# Patient Record
Sex: Female | Born: 1975 | Race: Black or African American | Hispanic: No | Marital: Single | Smoking: Current every day smoker
Health system: Southern US, Community
[De-identification: ages and names within clinical notes are randomized; demographics above are authoritative.]

## PROBLEM LIST (undated history)

## (undated) DIAGNOSIS — D573 Sickle-cell trait: Secondary | ICD-10-CM

## (undated) DIAGNOSIS — IMO0002 Reserved for concepts with insufficient information to code with codable children: Secondary | ICD-10-CM

## (undated) DIAGNOSIS — D649 Anemia, unspecified: Secondary | ICD-10-CM

## (undated) DIAGNOSIS — T8189XA Other complications of procedures, not elsewhere classified, initial encounter: Secondary | ICD-10-CM

## (undated) DIAGNOSIS — L0291 Cutaneous abscess, unspecified: Secondary | ICD-10-CM

## (undated) DIAGNOSIS — A539 Syphilis, unspecified: Secondary | ICD-10-CM

## (undated) DIAGNOSIS — D571 Sickle-cell disease without crisis: Secondary | ICD-10-CM

## (undated) DIAGNOSIS — M329 Systemic lupus erythematosus, unspecified: Secondary | ICD-10-CM

## (undated) HISTORY — PX: IRRIGATION AND DEBRIDEMENT ABSCESS: SHX5252

## (undated) HISTORY — DX: Anemia, unspecified: D64.9

## (undated) HISTORY — DX: Sickle-cell disease without crisis: D57.1

## (undated) HISTORY — DX: Cutaneous abscess, unspecified: L02.91

## (undated) HISTORY — DX: Syphilis, unspecified: A53.9

---

## 1999-02-10 ENCOUNTER — Other Ambulatory Visit: Admission: RE | Admit: 1999-02-10 | Discharge: 1999-02-10 | Payer: Self-pay | Admitting: *Deleted

## 1999-09-17 ENCOUNTER — Other Ambulatory Visit: Admission: RE | Admit: 1999-09-17 | Discharge: 1999-09-17 | Payer: Self-pay | Admitting: *Deleted

## 2000-04-12 ENCOUNTER — Emergency Department (HOSPITAL_COMMUNITY): Admission: EM | Admit: 2000-04-12 | Discharge: 2000-04-12 | Payer: Self-pay | Admitting: Emergency Medicine

## 2000-09-26 ENCOUNTER — Emergency Department (HOSPITAL_COMMUNITY): Admission: EM | Admit: 2000-09-26 | Discharge: 2000-09-26 | Payer: Self-pay | Admitting: Emergency Medicine

## 2000-09-26 ENCOUNTER — Encounter: Payer: Self-pay | Admitting: Emergency Medicine

## 2001-10-23 ENCOUNTER — Ambulatory Visit (HOSPITAL_COMMUNITY): Admission: RE | Admit: 2001-10-23 | Discharge: 2001-10-23 | Payer: Self-pay | Admitting: *Deleted

## 2002-01-04 ENCOUNTER — Ambulatory Visit (HOSPITAL_COMMUNITY): Admission: RE | Admit: 2002-01-04 | Discharge: 2002-01-04 | Payer: Self-pay | Admitting: *Deleted

## 2002-01-30 ENCOUNTER — Inpatient Hospital Stay (HOSPITAL_COMMUNITY): Admission: AD | Admit: 2002-01-30 | Discharge: 2002-01-30 | Payer: Self-pay | Admitting: *Deleted

## 2002-02-25 ENCOUNTER — Inpatient Hospital Stay (HOSPITAL_COMMUNITY): Admission: AD | Admit: 2002-02-25 | Discharge: 2002-02-25 | Payer: Self-pay | Admitting: *Deleted

## 2002-03-30 ENCOUNTER — Encounter (HOSPITAL_COMMUNITY): Admission: AD | Admit: 2002-03-30 | Discharge: 2002-03-30 | Payer: Self-pay | Admitting: *Deleted

## 2002-04-03 ENCOUNTER — Inpatient Hospital Stay (HOSPITAL_COMMUNITY): Admission: AD | Admit: 2002-04-03 | Discharge: 2002-04-06 | Payer: Self-pay | Admitting: *Deleted

## 2002-06-11 ENCOUNTER — Inpatient Hospital Stay (HOSPITAL_COMMUNITY): Admission: AD | Admit: 2002-06-11 | Discharge: 2002-06-11 | Payer: Self-pay | Admitting: *Deleted

## 2006-09-03 ENCOUNTER — Emergency Department (HOSPITAL_COMMUNITY): Admission: EM | Admit: 2006-09-03 | Discharge: 2006-09-03 | Payer: Self-pay | Admitting: Emergency Medicine

## 2006-12-30 ENCOUNTER — Emergency Department (HOSPITAL_COMMUNITY): Admission: EM | Admit: 2006-12-30 | Discharge: 2006-12-30 | Payer: Self-pay | Admitting: Emergency Medicine

## 2007-02-08 ENCOUNTER — Emergency Department (HOSPITAL_COMMUNITY): Admission: EM | Admit: 2007-02-08 | Discharge: 2007-02-09 | Payer: Self-pay | Admitting: Emergency Medicine

## 2007-06-16 ENCOUNTER — Emergency Department (HOSPITAL_COMMUNITY): Admission: EM | Admit: 2007-06-16 | Discharge: 2007-06-16 | Payer: Self-pay | Admitting: Family Medicine

## 2007-07-03 ENCOUNTER — Encounter (HOSPITAL_BASED_OUTPATIENT_CLINIC_OR_DEPARTMENT_OTHER): Admission: RE | Admit: 2007-07-03 | Discharge: 2007-10-01 | Payer: Self-pay | Admitting: Emergency Medicine

## 2007-10-04 ENCOUNTER — Encounter (HOSPITAL_BASED_OUTPATIENT_CLINIC_OR_DEPARTMENT_OTHER): Admission: RE | Admit: 2007-10-04 | Discharge: 2008-01-02 | Payer: Self-pay | Admitting: Surgery

## 2008-08-13 ENCOUNTER — Emergency Department (HOSPITAL_COMMUNITY): Admission: EM | Admit: 2008-08-13 | Discharge: 2008-08-13 | Payer: Self-pay | Admitting: Emergency Medicine

## 2008-10-08 ENCOUNTER — Emergency Department (HOSPITAL_COMMUNITY): Admission: EM | Admit: 2008-10-08 | Discharge: 2008-10-08 | Payer: Self-pay | Admitting: Emergency Medicine

## 2009-04-16 ENCOUNTER — Emergency Department (HOSPITAL_COMMUNITY): Admission: EM | Admit: 2009-04-16 | Discharge: 2009-04-16 | Payer: Self-pay | Admitting: Family Medicine

## 2010-10-13 NOTE — Assessment & Plan Note (Signed)
[facility]   Wendy Hogan, TESS           ACCOUNT NO.:  [id number]   MEDICAL RECORD NO.:  [id number]      DATE OF BIRTH:  [birth year]   PHYSICIAN:  Jonelle Sports. Sevier, M.D.  VISIT DATE:  09/20/2007                                   OFFICE VISIT   HISTORY:  This 35 year old black female is followed for a cavitary wound  of the right buttock, secondary to earlier sterile abscess in turn  secondary to Depo injection site.  She was treated initially with wound  VAC therapy, but most recently has been simply applying wound gel and a  dry dressing.   She reports minimal drainage from the wound.  No odor.  No pain.  No  fever, chills, or systemic symptoms and nothing else that she perceives  as a change.   PHYSICAL EXAMINATION:  VITAL SIGNS:  Blood pressure 122/67, pulse 96,  respirations 16, and temperature 98.7.  The wound now measures 1.1 x 0.8  x 0.4 cm with a circumferential undermining of 1-3 mm, particularly the  3-mm being from approximately 1- 4 o'clock on the wound site.   IMPRESSION:  Somewhat stalled cavitary wound, right buttock.   DISPOSITION:  We will today change from the use of wound gel to the use  Iodosorb gel on an every-other-day basis to this wound.  The patient  will redress it at home by cleansing it every other day and reapplying  the Iodosorb gel and covering the wound with a dry pad.   Followup visit will be here in 2 weeks.           ______________________________  Jonelle Sports Cheryll Cockayne, M.D.     RES/MEDQ  D:  09/20/2007  T:  09/21/2007  Job:  [id number]

## 2010-10-13 NOTE — Assessment & Plan Note (Signed)
[facility]   NAME:  Wendy Hogan, Wendy Hogan NO.:  [id number]   MEDICAL RECORD NO.:  [id number]      DATE OF BIRTH:  [birth year]   PHYSICIAN:  Maxwell Caul, M.D. VISIT DATE:  08/04/2007                                   OFFICE VISIT   [name] has a wound on the right buttock of uncertain etiology.  I  biopsied this when she first came in.  The results were indeterminate.  Cultures suggested fecal flora and even though there was no predominant  organism I gave her a course of antibiotics.  More recently we have been  packing this with silver based packings.  She continues to complain of  discomfort and today there was a fair amount of drainage again and some  malodor.   PHYSICAL EXAMINATION:  VITALS:  Temperature 99.3, pulse 111,  respirations 16, blood pressure 122/80.  The wound itself does not  appear to be infected.  Its measurements are 1.3 x 1.5 x 1.2 which may  be even somewhat larger than previously.  There is a considerable amount  of sinus of undermining which is roughly the same as when she came in.  The base of the wound does not look to be changed, is granulated and  not obviously infected although has certainly been no progression of  healing here.   IMPRESSION:  Ulcer on the right buttock of uncertain etiology, possibly  as a consequence of a Depo estrogen shot she received (see my previous  notes).  I think the treatment of choice here is now a wound vac.  We  will arrange this with silver based foam packing.  I did culture this  wound again, although I do not think there is any indication for empiric  antibiotics here.           ______________________________  Maxwell Caul, M.D.     MGR/MEDQ  D:  08/04/2007  T:  08/04/2007  Job:  [id number]

## 2010-10-13 NOTE — Assessment & Plan Note (Signed)
[facility]   NAMEJENDAYA, Wendy Hogan           ACCOUNT NO.:  [id number]   MEDICAL RECORD NO.:  [id number]      DATE OF BIRTH:  [birth year]   PHYSICIAN:  Jonelle Sports. Sevier, M.D.  VISIT DATE:  09/06/2007                                   OFFICE VISIT   HISTORY:  This is a 35 year old black female who is followed for an  abscess cavity (with infection long since resolved) of the right buttock  secondary to Depot contraceptive injections.  This has been managed most  recently with wound VAC and then converted simply to a daily cleansing.   The the patient reports that, she has had no significant pain, no  drainage, no odor, and feels that things are doing generally  satisfactory.   PHYSICAL EXAMINATION:  Blood pressure 129/70, pulse 90, respirations 18,  and temperature 98.3.  The wound on the right buttock now measures 1.5 x  1.0 cm with a depth of 0.3 cm.  The wound base is clean, does appear a  bit dry at this point.   IMPRESSION:  Satisfactory course, cavity wound secondary to sterile  abscess right buttock.   DISPOSITION:  1. Currently, no debridement of the wound is required today.  2. The patient is instructed to continue cleansing this daily at home      with warm water and antibacterial soap, to rinse it well, to pat it      dry, and then to place a small amount of hydrogel on the wound and      covered with a dry gauze.   The wound is dressed here today prior her departure and similar manner  using hydrogel and a small covering dry-gauze pad.   Followup visit will be here in 2 weeks.           ______________________________  Jonelle Sports Cheryll Cockayne, M.D.     RES/MEDQ  D:  09/06/2007  T:  09/07/2007  Job:  [id number]

## 2010-10-13 NOTE — Assessment & Plan Note (Signed)
[facility]   NAME:  Wendy Hogan, Wendy Hogan NO.:  [id number]   MEDICAL RECORD NO.:  [id number]      DATE OF BIRTH:  [birth year]   PHYSICIAN:  Maxwell Caul, M.D. VISIT DATE:  07/07/2007                                   OFFICE VISIT   Ms. Wendy Hogan was referred here for review of a chronic wound on her  right buttock.  It would appear that she was referred here from Wendy Hogan, M.D., at [facility], although her treatment  there dates back to September.   The history here was a bit difficult to elicit and only came out after  several rounds of questioning by myself and the nurse.  It would appear  that the patient initially noticed a knot on her right buttock area.  This was pruritic and eventually ruptured into an ulcer.  Although she  initially denied any antecedent trauma or other factors to the area,  ultimately the nurse was able to obtain that she had  received a Depo  injection at the [facility] for  possible  contraception.  In any case, the relationship between the injection and  the pathogenesis of the wound is not totally clear.  Nevertheless,  initially the patient went to back to the [facility].  She was noted to have an RPR that was reactive but only had a  minor titer.  Her MHA/TP was at 1 and 8.  This was compatible with  previously treated syphilis.  She also had DNA for GC and chlamydia  done, which were both negative.  Her AIDS test was negative as well.  In  any case, ultimately she was referred to [facility].  At  [facility] in August was diagnosed with a skin ulcer and  given a prescription for doxycycline for 14 days.  Since then, the  patient describes the wound as waxing and waning.  Sometimes she states  it looks like it will close over and then it opens up again.  It has had  a moderate amount of drainage.  She has not been  systemically ill and  has not noticed any undue pain.   Past medical history includes sickle cell trait.  She has no other  medical diagnoses.  She is not a diabetic.   MEDICATIONS:  She is only on amoxicillin for a recent dental procedure.   PHYSICAL EXAMINATION:  GENERAL:  A somewhat thin woman who looks her  stated age.  She states she has always been thin.  Denies weight loss,  etc.  LYMPH:  She had fairly prominent nodes in the right inguinal area as  well as the left inguinal area; however there was no evidence of other  adenopathy.  ABDOMEN:  No liver or spleen was palpable.  There were no masses noted.   WOUND EXAMINATION:  Temperature 98.3, pulse 94, respirations 16, blood  pressure 112/80.  The wound in question was on her right buttock near  her right ischial tuberosity measured 1.0 x 0.8 x 1.0; however there was  a considerable amount of undermining with 2 cm at 6 o'clock to 9 o'clock  and 1.5 cm at an arc from  12 o'clock to 6 o'clock.  The base of the  wound appeared granulated.  I was concerned about the surrounding  tissue,  which appeared to be firm and tender to me.  This did not have  the feel of normal skin.  The wound does not probe to bone; however  there is the undermining noted above.   IMPRESSION:  Right buttock ulcer.  The issue here was the exact  pathogenesis of this wound.  With questioning, we were able to elicit a  possible relationship to an injection she had for contraception;  however, that relationship is far from clear.  I was concerned enough  about the surrounding tissue around the wound to do a skin biopsy, which  was done without complication.  We also did a deep culture of the wound  bed with a mild amount of purulent drainage noted.  I did not start her  empirically on antibiotics.  The wound was packed with Aquacel Ag and a  protective adherent dressing.   We will see her back in a week, at which time cultures and skin biopsy  should be  available.  I did not think that the bilateral inguinal  adenopathy had anything to do with the wound per se and I believe she  was probably worked up for this at the [facility].           ______________________________  Maxwell Caul, M.D.     MGR/MEDQ  D:  07/07/2007  T:  07/08/2007  Job:  [id number]

## 2010-10-13 NOTE — Assessment & Plan Note (Signed)
[facility]   NAMEREBEKA, KIMBLE           ACCOUNT NO.:  [id number]   MEDICAL RECORD NO.:  [id number]      DATE OF BIRTH:  March 16, 1976   PHYSICIAN:  Theresia Majors. Tanda Rockers, M.D. VISIT DATE:  08/14/2007                                   OFFICE VISIT   SUBJECTIVE:  Ms. Watanabe is being followed for a hematoma/abscess  involving the right buttock.  We treated her with a Wound VAC with  changing 3 times a week.  She reports that there has been less pain.  She continues to be ambulatory.  There has been no malodor and there has  been no fever.   OBJECTIVE:  VITAL SIGNS:  Blood pressure is 115/75, respirations 18,  pulse rate 92, temperature 98.7.  MUSCULOSKELETAL:  Inspection of the right buttocks shows that there has  been decrease in both area and volume of the wound.  There appears to be  healthy granulation tissue at the base of the wound with a decrease  undermining at 7 o'clock.  There is no excessive drainage.  There is no  malodor.  There is no evidence of periwound inflammation or induration.   ASSESSMENT:  Clinical improvement with response to negative vacuum  drainage therapy.   PLAN:  We will continue the Wound VAC per protocol and re-evaluate the  patient in one week.      Harold A. Tanda Rockers, M.D.  Electronically Signed     HAN/MEDQ  D:  08/14/2007  T:  08/14/2007  Job:  [id number]

## 2010-10-13 NOTE — Assessment & Plan Note (Signed)
[facility]   NAME:  Wendy Hogan, Wendy Hogan NO.:  [id number]   MEDICAL RECORD NO.:  [id number]      DATE OF BIRTH:  09/11/1975   PHYSICIAN:  Maxwell Caul, M.D. VISIT DATE:  07/28/2007                                   OFFICE VISIT   PURPOSE OF TODAY'S VISIT:  [name] returns today for a wound on  her right lateral buttock.  This may have occurred at a time for a Depo  shot for contraception although we have never really been certain.  The  wound was cultured showing a large number of negative rods on gram stain  but no culture.  Last time she was here 2 weeks ago we packed this with  Aquacel AG.   The patient did not tolerate the Aquacel AG and removed it.  She simply  has been putting a plain gauze on this.  There continues to be a  moderate amount of drainage.  We did culture this and biopsy this.  Nothing was really helpful.  The culture ultimately grew normal fecal  Flura which lead me to use ciprofloxacin since this was hardly an area  that should have been contaminated.   PHYSICAL EXAMINATION:  VITALS:  On examination temperature is 98.5,  pulse 101, respirations 16, blood pressure 119/78.  The right buttock  wound measures 0.9 x 1 x 0.4.  There has been improvement in the depth.  The granulation tissue at the base of the wound looks healthy.  There is  nothing that needs to be cultured here.  She has a considerable amount  of overhang perhaps 1.5 cm in all directions, however.   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  I am going to repacked this with silver  alginate.  I have continued to counsel the patient on pressure relief  measures.  She has completed her ciprofloxacin. I see nothing about this  that needs additional antibiotics right now other than the amount of  drainage.  We will see her again in a week's time.  Ultimately this may  require further opening and/or a  wound vac.           ______________________________  Maxwell Caul, M.D.     MGR/MEDQ  D:  07/28/2007  T:  07/29/2007  Job:  [id number]

## 2010-10-13 NOTE — Assessment & Plan Note (Signed)
[facility]   Wendy Hogan, Wendy Hogan           ACCOUNT NO.:  [id number]   MEDICAL RECORD NO.:  [id number]      DATE OF BIRTH:  Nov 04, 1975   PHYSICIAN:  Theresia Majors. Tanda Hogan, M.D.      VISIT DATE:                                   OFFICE VISIT   Wendy Hogan returns for followup of a hematoma/abscess of her right  buttock.  In the interim, she has experienced markedly decreased pain to  the point that she is not taking any pain medicine now.  She continues  to shower the area twice daily with antibacterial soap.  She has had no  fever and no excessive drainage.   OBJECTIVE:  Blood pressure is 120/75, respirations 16, pulse rate 87,  and temperature 99.  Inspection of the right buttock shows that there is  contraction of the wound.  There is a 100% granulating base with  advancing epithelium.  There is no drainage and no malodor.  There is no  hyperemia or tenderness, whatsoever.   ASSESSMENT:  Clinical improvement of the hematoma/abscess.   PLAN:  We will continue the Iodosorb every 3 days.  We will continue  twice daily antiseptic soap washes with thorough irrigation of the  wound.  We will reevaluate the patient in 2 weeks p.r.n.      Wendy Hogan, M.D.  Electronically Signed     HAN/MEDQ  D:  10/05/2007  T:  10/06/2007  Job:  [id number]

## 2010-10-13 NOTE — Assessment & Plan Note (Signed)
[facility]   NAME:  [name], Wendy Hogan NO.:  [id number]   MEDICAL RECORD NO.:  [id number]      DATE OF BIRTH:  07/29/1975   PHYSICIAN:  Maxwell Caul, M.D. VISIT DATE:  07/14/2007                                   OFFICE VISIT   [name] came in today for follow-up of her initial review last  week.  She has had a chronic wound on her right buttock that may have  occurred after a Depo shot for contraception (see my note from July 07, 2007).  Last week we biopsied and cultured the area and packed this  with Aquacel Ag.   In the interim, she has not had any undue pain or fever.  However, there  continues to be a moderate amount of drainage.  Results from last week  showed a skin biopsy which really looks compatible with an ulcer with  reparative changes (inflamed granulation tissue).  Certainly, nothing to  point to a specific cause of this chronic wound.  The culture showed  normal fecal flora.  However, I found that odd that she would have such  a large number of gram-negative rods on a Gram stain in this area.   WOUND EXAM:  Temperature is 98.4, pulse 94, respirations 16, blood  pressure is 120/89.  The wound measures 1 x 1 x 0.8 with about 1.5 cm of  undermining.  There is no evidence of acute infection.  There is some  purulent drainage.   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  I have continued the  packing with Aquacel Ag.  I have counseled the patient on pressure  relief measures.  Because of the abundant number of gram-negative rods  reported on the gram stain and continued drainage, I have put her on  ciprofloxacin 500 b.i.d. for 10 days although the ultimate culture did  not grow a specific pathogen.  We will see her again in a week.           ______________________________  Maxwell Caul, M.D.     MGR/MEDQ  D:  07/14/2007  T:   07/16/2007  Job:  [id number]

## 2010-10-13 NOTE — Assessment & Plan Note (Signed)
[facility]   Wendy Hogan, Wendy Hogan           ACCOUNT NO.:  [id number]   MEDICAL RECORD NO.:  [id number]      DATE OF BIRTH:  08-13-1975   PHYSICIAN:  Theresia Majors. Tanda Rockers, M.D. VISIT DATE:  08/21/2007                                   OFFICE VISIT   SUBJECTIVE:  Ms. Oh is a 35 year old lady who we are following for  right buttock cavitary defect.  We have treated this patient with the  wound vac.  In the interim she reports that there has been less pain.  There has been no drainage.  The wound vac has been functioning without  difficulty or triggering of the alarm. There has been no fever.   OBJECTIVE:  VITALS:  Her blood pressure is 124/94, respirations 18,  pulse rate 90, temperature is 98.1.  Inspection of the right buttock  shows that the wound itself is clean with healthy-appearing granulation.  There is no drainage.  There is no malodor.   ASSESSMENT:  Clinical improvement.   PLAN:  We will continue the use of the wound vac and reevaluate the  patient in 1 week.      Harold A. Tanda Rockers, M.D.  Electronically Signed     HAN/MEDQ  D:  08/21/2007  T:  08/21/2007  Job:  [id number]

## 2010-10-13 NOTE — Assessment & Plan Note (Signed)
[facility]   Wendy Hogan, Wendy Hogan           ACCOUNT NO.:  [id number]   MEDICAL RECORD NO.:  [id number]      DATE OF BIRTH:  1975-11-13   PHYSICIAN:  Theresia Majors. Tanda Rockers, M.D. VISIT DATE:  08/28/2007                                   OFFICE VISIT   SUBJECTIVE:  Wendy Hogan is a 35 year old lady who we are treating for  hematoma/abscess involving the right buttock.  In the interim she has  worn a wound vac.  There has been appreciable decrease in the volume of  the wound, decreased drainage and markedly improvement in her level of  pain.   PHYSICAL EXAMINATION:  GENERAL:  She is unaccompanied.  VITALS:  Blood pressure is 118/78, respirations 16, pulse rate 86,  temperature 98.3.  Inspection of the right buttock shows that the wound  has contracted nicely.  There is 100 cm granulation with advancing  epithelium from the periphery.  There is absolutely no drainage.   ASSESSMENT:  Clinical improved cavitary wound right buttock.   PLAN:  We will discontinue the use of the wound vac and instructed the  patient to use b.i.d. antiseptic soap washes and showers with thorough  drying.  We will have her reevaluated weekly by the nurse and every 2  weeks by the physician.  We have given the patient opportunity to ask  questions.  She seems to understand these instructions and indicates  that she will be compliant.      Harold A. Tanda Rockers, M.D.  Electronically Signed     HAN/MEDQ  D:  08/28/2007  T:  08/28/2007  Job:  [id number]

## 2010-10-13 NOTE — Assessment & Plan Note (Signed)
[facility]   NAMESEVANNAH, MADIA           ACCOUNT NO.:  [id number]   MEDICAL RECORD NO.:  [id number]      DATE OF BIRTH:  27-Jan-1976   PHYSICIAN:  Theresia Majors. Tanda Rockers, M.D. VISIT DATE:  08/07/2007                                   OFFICE VISIT   SUBJECTIVE:  Mrs. Livingstone is a 35 year old lady who apparently had a  Depo-Provera injection placed into her right buttock area resulting in a  hematoma which subsequently became infected, ulcerated, and developed an  ulceration with a cavitary component.  She had been draining over the  past several weeks and has been seen in the wound center.  Dr. Leanord Hawking  has recommended a wound vac for control of her drainage and to  facilitate.  There has been moderate discomfort related to pain which is  controlled with p.o. analgesia.  There has been no interim fever.   OBJECTIVE:  VITAL SIGNS:  Stable.  She is afebrile.  Inspection of the wound shows undermining circumstantially with no  evidence of excessive necrosis, cellulitis, lymphangitis, or abscess  formation.  The wound is moderately tender on manipulation.  The tissue  within the cavity appears to be healthy and well vascularized.  There  are no neurological deficits in the lower extremities.   IMPRESSION:  Hematoma/abscess related to hypodermatic injection of birth  control.   PLAN:  We will proceed with the wound vac as ordered per Dr. Leanord Hawking.  The patient will be treated per protocol.      Harold A. Tanda Rockers, M.D.  Electronically Signed     HAN/MEDQ  D:  08/07/2007  T:  08/08/2007  Job:  [id number]

## 2011-03-15 LAB — WOUND CULTURE: Gram Stain: NONE SEEN

## 2011-03-21 ENCOUNTER — Inpatient Hospital Stay (INDEPENDENT_AMBULATORY_CARE_PROVIDER_SITE_OTHER)
Admission: RE | Admit: 2011-03-21 | Discharge: 2011-03-21 | Disposition: A | Discharge status: Home / Self Care | Payer: Medicaid Other | Source: Ambulatory Visit | Attending: Family Medicine | Admitting: Family Medicine

## 2011-03-21 DIAGNOSIS — N390 Urinary tract infection, site not specified: Secondary | ICD-10-CM

## 2011-03-21 DIAGNOSIS — R05 Cough: Secondary | ICD-10-CM

## 2011-03-21 DIAGNOSIS — R059 Cough, unspecified: Secondary | ICD-10-CM

## 2011-03-21 LAB — POCT URINALYSIS DIP (DEVICE)
Glucose, UA: 100 mg/dL — AB
Nitrite: POSITIVE — AB
Protein, ur: >=300 mg/dL — AB
Specific Gravity, Urine: >=1.03 (ref 1.005–1.030)
Urobilinogen, UA: 1 mg/dL (ref 0.0–1.0)
pH: 6 (ref 5.0–8.0)

## 2011-03-21 LAB — POCT PREGNANCY, URINE: Preg Test, Ur: NEGATIVE

## 2011-08-07 ENCOUNTER — Emergency Department (HOSPITAL_COMMUNITY)
Admission: EM | Admit: 2011-08-07 | Discharge: 2011-08-08 | Disposition: A | Discharge status: Home / Self Care | Payer: Medicaid Other | Attending: Emergency Medicine | Admitting: Emergency Medicine

## 2011-08-07 ENCOUNTER — Encounter (HOSPITAL_COMMUNITY): Payer: Self-pay | Admitting: *Deleted

## 2011-08-07 DIAGNOSIS — IMO0001 Reserved for inherently not codable concepts without codable children: Secondary | ICD-10-CM | POA: Insufficient documentation

## 2011-08-07 DIAGNOSIS — Z09 Encounter for follow-up examination after completed treatment for conditions other than malignant neoplasm: Secondary | ICD-10-CM | POA: Insufficient documentation

## 2011-08-07 DIAGNOSIS — L0231 Cutaneous abscess of buttock: Secondary | ICD-10-CM | POA: Insufficient documentation

## 2011-08-07 DIAGNOSIS — L0291 Cutaneous abscess, unspecified: Secondary | ICD-10-CM

## 2011-08-07 HISTORY — DX: Sickle-cell trait: D57.3

## 2011-08-07 HISTORY — DX: Other complications of procedures, not elsewhere classified, initial encounter: T81.89XA

## 2011-08-07 NOTE — ED Notes (Signed)
Pt has non-healing wound on R buttock x 5 years. Pt states feels that wound is infected.

## 2011-08-08 LAB — DIFFERENTIAL
Basophils Absolute: 0 10*3/uL (ref 0.0–0.1)
Basophils Relative: 0 % (ref 0–1)
Eosinophils Absolute: 0 10*3/uL (ref 0.0–0.7)
Eosinophils Relative: 0 % (ref 0–5)
Lymphocytes Relative: 16 % (ref 12–46)
Lymphs Abs: 2.7 10*3/uL (ref 0.7–4.0)
Monocytes Absolute: 1.8 10*3/uL — ABNORMAL HIGH (ref 0.1–1.0)
Monocytes Relative: 11 % (ref 3–12)
Neutro Abs: 12.2 10*3/uL — ABNORMAL HIGH (ref 1.7–7.7)
Neutrophils Relative %: 73 % (ref 43–77)

## 2011-08-08 LAB — CBC
HCT: 25.7 % — ABNORMAL LOW (ref 36.0–46.0)
Hemoglobin: 8.6 g/dL — ABNORMAL LOW (ref 12.0–15.0)
MCH: 21.2 pg — ABNORMAL LOW (ref 26.0–34.0)
MCHC: 33.5 g/dL (ref 30.0–36.0)
MCV: 63.3 fL — ABNORMAL LOW (ref 78.0–100.0)
Platelets: 351 10*3/uL (ref 150–400)
RBC: 4.06 MIL/uL (ref 3.87–5.11)
RDW: 17.1 % — ABNORMAL HIGH (ref 11.5–15.5)
WBC: 16.7 10*3/uL — ABNORMAL HIGH (ref 4.0–10.5)

## 2011-08-08 LAB — BASIC METABOLIC PANEL
BUN: 7 mg/dL (ref 6–23)
CO2: 27 mEq/L (ref 19–32)
Calcium: 8.8 mg/dL (ref 8.4–10.5)
Chloride: 97 mEq/L (ref 96–112)
Creatinine, Ser: 0.58 mg/dL (ref 0.50–1.10)
GFR calc Af Amer: >90 mL/min (ref >90)
GFR calc non Af Amer: >90 mL/min (ref >90)
Glucose, Bld: 127 mg/dL — ABNORMAL HIGH (ref 70–99)
Potassium: 3.4 mEq/L — ABNORMAL LOW (ref 3.5–5.1)
Sodium: 132 mEq/L — ABNORMAL LOW (ref 135–145)

## 2011-08-08 MED ORDER — SULFAMETHOXAZOLE-TRIMETHOPRIM 800-160 MG PO TABS: 1.0000 | ORAL_TABLET | Freq: Two times a day (BID) | ORAL | Status: AC

## 2011-08-08 MED ORDER — LIDOCAINE-EPINEPHRINE (PF) 1 %-1:200000 IJ SOLN
INTRAMUSCULAR | Status: AC
  Administered 2011-08-08: 06:00:00
  Filled 2011-08-08: qty 10

## 2011-08-08 NOTE — ED Provider Notes (Signed)
History     CSN: [id number]  Arrival date & time 08/07/11  2300   First MD Initiated Contact with Patient 08/08/11 0135      Chief Complaint  Patient presents with  . Wound Check    (Consider location/radiation/quality/duration/timing/severity/associated sxs/prior treatment) The history is provided by the patient.   right but talks pain swelling with history of wound last 5 years. No fevers or chills. No nausea or vomiting. Patient has noticed some drainage coming from the wound it has become significantly more painful. Hurts to sit on the area. No difficulty with bowel movements. No alleviating factors. Pain is sharp in quality and not radiating. Patient used to see wound center. She has never seen a Development worker, international aid for this. Moderate in severity.  Past Medical History  Diagnosis Date  . Non-healing surgical wound     R buttock  . Sickle cell trait     Past Surgical History  Procedure Date  . Irrigation and debridement abscess     R buttock    History reviewed. No pertinent family history.  History  Substance Use Topics  . Smoking status: Former Smoker    Quit date: 08/01/2011  . Smokeless tobacco: Not on file  . Alcohol Use: Yes     occasionally    OB History    Grav Para Term Preterm Abortions TAB SAB Ect Mult Living                  Review of Systems  Constitutional: Negative for fever and chills.  HENT: Negative for neck pain and neck stiffness.   Eyes: Negative for pain.  Respiratory: Negative for shortness of breath.   Cardiovascular: Negative for chest pain.  Gastrointestinal: Negative for abdominal pain.  Genitourinary: Negative for dysuria.  Musculoskeletal: Negative for back pain.  Skin: Positive for wound. Negative for rash.  Neurological: Negative for headaches.  All other systems reviewed and are negative.    Allergies  Review of patient's allergies indicates no known allergies.  Home Medications   Current Outpatient Rx  Name Route Sig  Dispense Refill  . FERROUS FUMARATE 325 (106 FE) MG PO TABS Oral Take 1 tablet by mouth daily.      BP 111/68  Pulse 96  Temp(Src) 99.5 F (37.5 C) (Oral)  Resp 18  SpO2 100%  LMP 07/25/2011  Physical Exam  Constitutional: She is oriented to person, place, and time. She appears well-developed and well-nourished.  HENT:  Head: Normocephalic and atraumatic.  Eyes: Conjunctivae and EOM are normal. Pupils are equal, round, and reactive to light.  Neck: Trachea normal. Neck supple. No thyromegaly present.  Cardiovascular: Normal rate, regular rhythm, S1 normal, S2 normal and normal pulses.     No systolic murmur is present   No diastolic murmur is present  Pulses:      Radial pulses are 2+ on the right side, and 2+ on the left side.  Pulmonary/Chest: Effort normal and breath sounds normal. She has no wheezes. She has no rhonchi. She has no rales. She exhibits no tenderness.  Abdominal: Soft. Normal appearance and bowel sounds are normal. There is no tenderness. There is no CVA tenderness and negative Murphy's sign.  Musculoskeletal:       Right gluteal region with very large area of induration, tenderness, erythema and no underlying fluctuance. No perirectal involvement  Neurological: She is alert and oriented to person, place, and time. She has normal strength. No cranial nerve deficit or sensory deficit. GCS eye  subscore is 4. GCS verbal subscore is 5. GCS motor subscore is 6.  Skin: Skin is warm and dry. No rash noted. She is not diaphoretic.  Psychiatric: Her speech is normal.       Cooperative and appropriate    ED Course  INCISION AND DRAINAGE Date/Time: 08/08/2011 5:50 AM Performed by: Sunnie Nielsen Authorized by: Sunnie Nielsen Consent: Verbal consent obtained. Risks and benefits: risks, benefits and alternatives were discussed Consent given by: patient Patient understanding: patient states understanding of the procedure being performed Patient consent: the patient's  understanding of the procedure matches consent given Procedure consent: procedure consent matches procedure scheduled Patient identity confirmed: verbally with patient Time out: Immediately prior to procedure a "time out" was called to verify the correct patient, procedure, equipment, support staff and site/side marked as required. Type: abscess Location: Right gluteal. Anesthesia: local infiltration Local anesthetic: lidocaine 1% without epinephrine Anesthetic total: 4 ml Risk factor: underlying major vessel, underlying major nerve and coagulopathy Scalpel size: 11 Needle gauge: 22 Incision type: single with marsupialization Complexity: complex Drainage: purulent Drainage amount: copious Wound treatment: wound left open Packing material: 1/2 in iodoform gauze Patient tolerance: Patient tolerated the procedure well with no immediate complications.   (including critical care time)  Labs Reviewed  CBC - Abnormal; Notable for the following:    WBC 16.7 (*)    Hemoglobin 8.6 (*)    HCT 25.7 (*)    MCV 63.3 (*)    MCH 21.2 (*)    RDW 17.1 (*)    All other components within normal limits  DIFFERENTIAL - Abnormal; Notable for the following:    Neutro Abs 12.2 (*)    Monocytes Absolute 1.8 (*)    All other components within normal limits  BASIC METABOLIC PANEL - Abnormal; Notable for the following:    Sodium 132 (*)    Potassium 3.4 (*)    Glucose, Bld 127 (*)    All other components within normal limits     MDM    Large abscess with I&D as above. No systemic symptoms. Noted elevated white blood cell count. Plan antibiotics and followup in 2 days for recheck. General surgery referral provided       Sunnie Nielsen, MD 08/08/11 [phone]

## 2011-08-08 NOTE — Discharge Instructions (Signed)

## 2011-08-10 ENCOUNTER — Telehealth (INDEPENDENT_AMBULATORY_CARE_PROVIDER_SITE_OTHER): Payer: Self-pay | Admitting: General Surgery

## 2011-08-10 ENCOUNTER — Ambulatory Visit (INDEPENDENT_AMBULATORY_CARE_PROVIDER_SITE_OTHER): Payer: Medicaid Other | Admitting: General Surgery

## 2011-08-10 ENCOUNTER — Encounter (INDEPENDENT_AMBULATORY_CARE_PROVIDER_SITE_OTHER): Payer: Self-pay | Admitting: General Surgery

## 2011-08-10 VITALS — BP 116/82 | HR 88 | Temp 97.6°F | Resp 12 | Ht 65.0 in | Wt 136.8 lb

## 2011-08-10 DIAGNOSIS — L0231 Cutaneous abscess of buttock: Secondary | ICD-10-CM

## 2011-08-10 DIAGNOSIS — L03317 Cellulitis of buttock: Secondary | ICD-10-CM

## 2011-08-10 LAB — WOUND CULTURE: Gram Stain: NONE SEEN

## 2011-08-10 NOTE — Progress Notes (Signed)
Patient ID: Wendy Hogan, female   DOB: 1976/02/06, 36 y.o.   MRN: [id number]  Chief Complaint  Patient presents with  . Abscess    gluteal abscess    HPI Wendy Hogan is a 36 y.o. female.   HPI This patient was referred by the emergency room for evaluation of a right buttock abscess which was drained 3 days ago by the emergency room. She has a history of a prior abscess in the area which had been ID previously but really never healed. She was seen in the emergency room with a repeat abscess and perform incision and drainage and she was told to followup in the surgery clinic for evaluation. She says that since her wound has been draining she feels much better. She has not been changing the dressing and was not given any wound care instructions. She was given Bactrim in the ER.  Past Medical History  Diagnosis Date  . Non-healing surgical wound     R buttock  . Sickle cell trait   . Anemia   . Sickle cell anemia     trait  . Abscess     gluteal    Past Surgical History  Procedure Date  . Irrigation and debridement abscess     R buttock    Family History  Problem Relation Age of Onset  . Cancer Maternal Grandfather     colon    Social History History  Substance Use Topics  . Smoking status: Former Smoker    Quit date: 08/01/2011  . Smokeless tobacco: Not on file  . Alcohol Use: Yes     occasionally    No Known Allergies  Current Outpatient Prescriptions  Medication Sig Dispense Refill  . ferrous fumarate (HEMOCYTE - 106 MG FE) 325 (106 FE) MG TABS Take 1 tablet by mouth daily.      Marland Kitchen sulfamethoxazole-trimethoprim (SEPTRA DS) 800-160 MG per tablet Take 1 tablet by mouth every 12 (twelve) hours.  10 tablet  0    Review of Systems Review of Systems All other review of systems negative or noncontributory except as stated in the HPI Blood pressure 116/82, pulse 88, temperature 97.6 F (36.4 C), temperature source Temporal, resp. rate 12, height 5\' 5"   (1.651 m), weight 136 lb 12.8 oz (62.052 kg), last menstrual period 07/25/2011.  Physical Exam Physical Exam  Vitals reviewed. Constitutional: She is oriented to person, place, and time. She appears well-developed and well-nourished. No distress.  HENT:  Head: Normocephalic and atraumatic.  Cardiovascular: Normal rate.   Pulmonary/Chest: Effort normal. No respiratory distress.  Neurological: She is alert and oriented to person, place, and time.  Skin: Skin is warm. She is not diaphoretic.       She has a 1.5 x 1.5cm opening with packing on the lateral right hip, packing removed and wound explored. It appears adequately drained, wound irrigated and repacked.  No cellulitis but she still has some edema and induration.    Data Reviewed   Assessment    Right hip abscess status post incision and drainage This abscess appears adequately drained but the skin opening is fairly small and there is some tunneling of the wound. The packing was removed and the wound was irrigated and it actually appears to be adequately drained.      Plan    I instructed her on wound care and she will continue with twice daily wound packing and she will follow up with [place] in 2 weeks for repeat evaluation  she will continue with the buttocks as prescribed in the emergency room        Lodema Pilot DAVID 08/10/2011, 4:06 PM

## 2011-08-11 NOTE — ED Notes (Signed)
I/D done patient treated with Septra. Sensitive to same-chart appended per protocol MD.

## 2011-08-12 ENCOUNTER — Telehealth (INDEPENDENT_AMBULATORY_CARE_PROVIDER_SITE_OTHER): Payer: Self-pay | Admitting: General Surgery

## 2011-08-26 ENCOUNTER — Ambulatory Visit (INDEPENDENT_AMBULATORY_CARE_PROVIDER_SITE_OTHER): Payer: Medicaid Other | Admitting: General Surgery

## 2011-08-26 VITALS — BP 104/70 | HR 80 | Resp 16 | Ht 65.0 in | Wt 139.8 lb

## 2011-08-26 DIAGNOSIS — L0291 Cutaneous abscess, unspecified: Secondary | ICD-10-CM

## 2011-08-26 NOTE — Progress Notes (Signed)
Subjective:     Patient ID: Wendy Hogan, female   DOB: Apr 09, 1976, 36 y.o.   MRN: [id number]  HPI This patient follows up status post incision and drainage of a right hip abscess by the emergency room he received to. I saw her back after her procedure and the wound looked fine the abscess appeared adequately drained. She has been doing twice daily dressing changes and stasis no longer has discomfort in the area. No longer on abx.  Review of Systems     Objective:   Physical Exam The wound is healing well with healthy granulation tissue she has signs of previous infection in induration and scarring in the area but this wound is healing well without sign of continued infection or undrained fluid.    Assessment:     Status post incision and drainage of right hip abscess has been doing well I recommend that she continue with her current wound care with once or twice daily dressing changes and this should heal with continued wound care. She will follow up with [place] in another 2-3 weeks if this wound does not heal completely.    Plan:     F/u in 2-3 weeks if wound not healed.

## 2012-08-04 ENCOUNTER — Emergency Department (HOSPITAL_COMMUNITY)
Admission: EM | Admit: 2012-08-04 | Discharge: 2012-08-04 | Disposition: A | Discharge status: Home / Self Care | Payer: Medicaid Other | Source: Home / Self Care

## 2012-08-04 ENCOUNTER — Encounter (HOSPITAL_COMMUNITY): Payer: Self-pay | Admitting: Emergency Medicine

## 2012-08-04 DIAGNOSIS — K089 Disorder of teeth and supporting structures, unspecified: Secondary | ICD-10-CM

## 2012-08-04 DIAGNOSIS — K047 Periapical abscess without sinus: Secondary | ICD-10-CM

## 2012-08-04 NOTE — ED Notes (Signed)
Pt c/o oral swelling onset yest Sx include: pain, swelling Denies: f/v/n/d Took advil today around 1200  She is alert and oriented w/no signs of acute distress.

## 2012-08-04 NOTE — ED Notes (Addendum)
Pt has signed out and has been discharged; paper Epic was down

## 2012-08-11 NOTE — ED Provider Notes (Signed)
History     CSN: [id number]  Arrival date & time 08/04/12  1341   None     Chief Complaint  Patient presents with  . Oral Swelling    (Consider location/radiation/quality/duration/timing/severity/associated sxs/prior treatment) Patient is a 37 y.o. female presenting with tooth pain. The history is provided by the patient.  Dental PainThe primary symptoms include mouth pain. Primary symptoms do not include fever. The symptoms began yesterday. The symptoms are worsening. The symptoms are new. The symptoms occur constantly.  Additional symptoms include: gum swelling and gum tenderness. Additional symptoms do not include: dental sensitivity to temperature, jaw pain, facial swelling and swollen glands.    Past Medical History  Diagnosis Date  . Non-healing surgical wound     R buttock  . Sickle cell trait   . Anemia   . Sickle cell anemia     trait  . Abscess     gluteal    Past Surgical History  Procedure Laterality Date  . Irrigation and debridement abscess      R buttock    Family History  Problem Relation Age of Onset  . Cancer Maternal Grandfather     colon    History  Substance Use Topics  . Smoking status: Former Smoker    Quit date: 08/01/2011  . Smokeless tobacco: Not on file  . Alcohol Use: Yes     Comment: occasionally    OB History   Grav Para Term Preterm Abortions TAB SAB Ect Mult Living                  Review of Systems  Constitutional: Negative for fever and chills.  HENT: Positive for dental problem. Negative for facial swelling.     Allergies  Review of patient's allergies indicates no known allergies.  Home Medications   Current Outpatient Rx  Name  Route  Sig  Dispense  Refill  . ferrous fumarate (HEMOCYTE - 106 MG FE) 325 (106 FE) MG TABS   Oral   Take 1 tablet by mouth daily.           BP 106/70  Pulse 78  Temp(Src) 98.5 F (36.9 C) (Oral)  Resp 16  SpO2 100%  LMP 07/28/2012  Physical Exam  Constitutional: Vital  signs are normal. She appears well-developed and well-nourished.  Appears in pain  HENT:  Mouth/Throat: Dental abscesses and dental caries present.    Dentition in very poor repair  Pulmonary/Chest: Effort normal.  Lymphadenopathy:       Head (right side): Submandibular adenopathy present. No submental and no tonsillar adenopathy present.       Head (left side): No submental, no submandibular and no tonsillar adenopathy present.    ED Course  Procedures (including critical care time)  Labs Reviewed - No data to display No results found.   1. Pain, dental   2. Dental abscess       MDM  This is late entry from paper chart used when computers went down during 08/04/12 visit.  Advised needed f/u with dentist, rx pcn 500mg  po QID for 10 days, rx Norco 5/3235mg  po 1-2 q4-6 hours prn pain, #10.          Cathlyn Parsons, NP 08/11/12 [phone]

## 2012-08-11 NOTE — ED Provider Notes (Signed)
Medical screening examination/treatment/procedure(s) were performed by non-physician practitioner and as supervising physician I was immediately available for consultation/collaboration.  David Keller, M.D.  David C Keller, MD 08/11/12 2309 

## 2013-04-30 LAB — HM PAP SMEAR: HM PAP: NORMAL

## 2013-10-18 ENCOUNTER — Telehealth: Payer: Self-pay | Admitting: Internal Medicine

## 2013-10-18 ENCOUNTER — Encounter: Payer: Self-pay | Admitting: Internal Medicine

## 2013-10-18 ENCOUNTER — Ambulatory Visit (INDEPENDENT_AMBULATORY_CARE_PROVIDER_SITE_OTHER): Payer: No Typology Code available for payment source | Admitting: Internal Medicine

## 2013-10-18 ENCOUNTER — Ambulatory Visit (INDEPENDENT_AMBULATORY_CARE_PROVIDER_SITE_OTHER)
Admission: RE | Admit: 2013-10-18 | Discharge: 2013-10-18 | Disposition: A | Discharge status: Home / Self Care | Payer: No Typology Code available for payment source | Source: Ambulatory Visit | Attending: Internal Medicine | Admitting: Internal Medicine

## 2013-10-18 VITALS — BP 108/68 | HR 67 | Temp 98.3°F | Ht 64.5 in | Wt 132.0 lb

## 2013-10-18 DIAGNOSIS — M25471 Effusion, right ankle: Secondary | ICD-10-CM

## 2013-10-18 DIAGNOSIS — Z0000 Encounter for general adult medical examination without abnormal findings: Secondary | ICD-10-CM

## 2013-10-18 DIAGNOSIS — F17200 Nicotine dependence, unspecified, uncomplicated: Secondary | ICD-10-CM

## 2013-10-18 DIAGNOSIS — M7989 Other specified soft tissue disorders: Secondary | ICD-10-CM

## 2013-10-18 DIAGNOSIS — M25476 Effusion, unspecified foot: Secondary | ICD-10-CM

## 2013-10-18 DIAGNOSIS — M25473 Effusion, unspecified ankle: Secondary | ICD-10-CM

## 2013-10-18 DIAGNOSIS — M25442 Effusion, left hand: Secondary | ICD-10-CM

## 2013-10-18 LAB — TSH: TSH: 0.22 u[IU]/mL — ABNORMAL LOW (ref 0.35–4.50)

## 2013-10-18 LAB — IBC PANEL
Iron: 22 ug/dL — ABNORMAL LOW (ref 42–145)
Saturation Ratios: 5.2 % — ABNORMAL LOW (ref 20.0–50.0)
TRANSFERRIN: 301.5 mg/dL (ref 212.0–360.0)

## 2013-10-18 LAB — COMPREHENSIVE METABOLIC PANEL
ALT: 9 U/L (ref 0–35)
AST: 16 U/L (ref 0–37)
Albumin: 3.5 g/dL (ref 3.5–5.2)
Alkaline Phosphatase: 73 U/L (ref 39–117)
BUN: 8 mg/dL (ref 6–23)
CALCIUM: 9.5 mg/dL (ref 8.4–10.5)
CHLORIDE: 104 meq/L (ref 96–112)
CO2: 27 mEq/L (ref 19–32)
CREATININE: 0.6 mg/dL (ref 0.4–1.2)
GFR: 137.41 mL/min (ref >60.00)
Glucose, Bld: 86 mg/dL (ref 70–99)
POTASSIUM: 3.9 meq/L (ref 3.5–5.1)
SODIUM: 137 meq/L (ref 135–145)
TOTAL PROTEIN: 8.3 g/dL (ref 6.0–8.3)
Total Bilirubin: 0.2 mg/dL (ref 0.2–1.2)

## 2013-10-18 LAB — CBC
HCT: 30.6 % — ABNORMAL LOW (ref 36.0–46.0)
HEMOGLOBIN: 9.7 g/dL — AB (ref 12.0–15.0)
MCHC: 31.6 g/dL (ref 30.0–36.0)
MCV: 61.4 fl — ABNORMAL LOW (ref 78.0–100.0)
Platelets: 463 10*3/uL — ABNORMAL HIGH (ref 150.0–400.0)
RBC: 4.98 Mil/uL (ref 3.87–5.11)
RDW: 21 % — AB (ref 11.5–15.5)
WBC: 9.9 10*3/uL (ref 4.0–10.5)

## 2013-10-18 LAB — LIPID PANEL
CHOL/HDL RATIO: 4
Cholesterol: 135 mg/dL (ref 0–200)
HDL: 35 mg/dL — ABNORMAL LOW (ref >39.00)
LDL CALC: 87 mg/dL (ref 0–99)
TRIGLYCERIDES: 66 mg/dL (ref 0.0–149.0)
VLDL: 13.2 mg/dL (ref 0.0–40.0)

## 2013-10-18 LAB — HEMOGLOBIN A1C: HEMOGLOBIN A1C: 4.8 % (ref 4.6–6.5)

## 2013-10-18 MED ORDER — NICOTINE 14 MG/24HR TD PT24: 14.0000 mg | MEDICATED_PATCH | Freq: Every day | TRANSDERMAL | Status: DC

## 2013-10-18 NOTE — Progress Notes (Signed)
Pre visit review using our clinic review tool, if applicable. No additional management support is needed unless otherwise documented below in the visit note. 

## 2013-10-18 NOTE — Progress Notes (Signed)
HPI  Pt presents to the clinic today to establish care. She has not had a PCP in many years. She does have a few concerns today. 1- She c/o left index finger swelling. She noticed this last week. She denies pain but reports that it is tight. She has trouble making a fist. She has tried ibuprofen OTC which did seem to help. 2- She also reports right ankle swelling. This also started last week. She denies pain but has noticed brusing and swelling. She denies specific injury to the area. Ibuprofen OTC did seem to help. 3-Nicotene Patches- she would like these to help with smoking cessation. She has quit in the past, cold [place]. She is only smoking 2-3 cigarettes per day.  Past Medical History  Diagnosis Date  . Non-healing surgical wound     R buttock  . Sickle cell trait   . Anemia   . Sickle cell anemia     trait  . Abscess     gluteal  . Syphilis     Current Outpatient Prescriptions  Medication Sig Dispense Refill  . ferrous fumarate (HEMOCYTE - 106 MG FE) 325 (106 FE) MG TABS Take 1 tablet by mouth daily.      . Multiple Vitamins-Minerals (WOMENS DAILY FORMULA PO) Take 1 capsule by mouth daily.       No current facility-administered medications for this visit.    No Known Allergies  Family History  Problem Relation Age of Onset  . Cancer Maternal Grandfather     colon  . Diabetes Mother   . Sickle cell trait Father   . Diabetes Maternal Aunt   . Diabetes Maternal Grandmother     History   Social History  . Marital Status: Single    Spouse Name: N/A    Number of Children: N/A  . Years of Education: N/A   Occupational History  . Not on file.   Social History Main Topics  . Smoking status: Current Every Day Smoker -- 0.15 packs/day    Types: Cigarettes  . Smokeless tobacco: Never Used  . Alcohol Use: Yes     Comment: occasionally  . Drug Use: No  . Sexual Activity: Yes    Birth Control/ Protection: None   Other Topics Concern  . Not on file   Social  History Narrative  . No narrative on file    ROS:  Constitutional: Denies fever, malaise, fatigue, headache or abrupt weight changes.  HEENT: Denies eye pain, eye redness, ear pain, ringing in the ears, wax buildup, runny nose, nasal congestion, bloody nose, or sore throat. Respiratory: Denies difficulty breathing, shortness of breath, cough or sputum production.   Cardiovascular: Denies chest pain, chest tightness, palpitations or swelling in the hands or feet.  Gastrointestinal: Denies abdominal pain, bloating, constipation, diarrhea or blood in the stool.  GU: Denies frequency, urgency, pain with urination, blood in urine, odor or discharge. Musculoskeletal: Pt reports joint pain and swelling. Denies decrease in range of motion, difficulty with gait, muscle pain.  Skin: Denies redness, rashes, lesions or ulcercations.  Neurological: Denies dizziness, difficulty with memory, difficulty with speech or problems with balance and coordination.   No other specific complaints in a complete review of systems (except as listed in HPI above).  PE:  BP 108/68  Pulse 67  Temp(Src) 98.3 F (36.8 C) (Oral)  Ht 5' 4.5" (1.638 m)  Wt 132 lb (59.875 kg)  BMI 22.32 kg/m2  SpO2 98%  LMP 10/02/2013 Wt Readings from Last 3  Encounters:  10/18/13 132 lb (59.875 kg)  08/26/11 139 lb 12.8 oz (63.413 kg)  08/10/11 136 lb 12.8 oz (62.052 kg)    General: Appears her stated age, well developed, well nourished in NAD. HEENT: Head: normal shape and size; Eyes: sclera white, no icterus, conjunctiva pink, PERRLA and EOMs intact; Ears: Tm's gray and intact, normal light reflex; Nose: mucosa pink and moist, septum midline; Throat/Mouth: Teeth present, mucosa pink and moist, no lesions or ulcerations noted.  Neck: Normal range of motion. Neck supple, trachea midline. No massses, lumps or thyromegaly present.  Cardiovascular: Normal rate and rhythm. S1,S2 noted.  No murmur, rubs or gallops noted. No JVD or BLE  edema. No carotid bruits noted. Pulmonary/Chest: Normal effort and positive vesicular breath sounds. No respiratory distress. No wheezes, rales or ronchi noted.  Abdomen: Soft and nontender. Normal bowel sounds, no bruits noted. No distention or masses noted. Liver, spleen and kidneys non palpable. Musculoskeletal: Normal range of motion. Small amount of swelling around the right ankle and left index finger. No difficulty with gait.  Neurological: Alert and oriented. Cranial nerves II-XII intact. Coordination normal. +DTRs bilaterally. Psychiatric: Mood and affect normal. Behavior is normal. Judgment and thought content normal.     BMET    Component Value Date/Time   NA 132* 08/08/2011 0346   K 3.4* 08/08/2011 0346   CL 97 08/08/2011 0346   CO2 27 08/08/2011 0346   GLUCOSE 127* 08/08/2011 0346   BUN 7 08/08/2011 0346   CREATININE 0.58 08/08/2011 0346   CALCIUM 8.8 08/08/2011 0346   GFRNONAA >90 08/08/2011 0346   GFRAA >90 08/08/2011 0346    Lipid Panel  No results found for this basename: chol, trig, hdl, cholhdl, vldl, ldlcalc    CBC    Component Value Date/Time   WBC 16.7* 08/08/2011 0346   RBC 4.06 08/08/2011 0346   HGB 8.6* 08/08/2011 0346   HCT 25.7* 08/08/2011 0346   PLT 351 08/08/2011 0346   MCV 63.3* 08/08/2011 0346   MCH 21.2* 08/08/2011 0346   MCHC 33.5 08/08/2011 0346   RDW 17.1* 08/08/2011 0346   LYMPHSABS 2.7 08/08/2011 0346   MONOABS 1.8* 08/08/2011 0346   EOSABS 0.0 08/08/2011 0346   BASOSABS 0.0 08/08/2011 0346    Hgb A1C No results found for this basename: HGBA1C     Assessment and Plan:  Preventative Health Maintenance:  Will obtain screening labs today Nicotene patches for smoking cessation  Left index finger pain and swelling:  ? Arthritis Will check xray  Continue Ibuprofen  Right ankle swelling:  Unsure of cause-no injury Elevate legs and continue ibuprofen  RTC in 1 year or sooner if needed

## 2013-10-18 NOTE — Addendum Note (Signed)
Addended by: Marchia Bond on: 10/18/2013 02:15 PM   Modules accepted: Orders

## 2013-10-18 NOTE — Patient Instructions (Addendum)

## 2013-10-18 NOTE — Telephone Encounter (Signed)
Relevant patient education assigned to patient using Emmi. ° °

## 2013-10-19 ENCOUNTER — Other Ambulatory Visit: Payer: Self-pay | Admitting: Internal Medicine

## 2013-10-19 DIAGNOSIS — R7989 Other specified abnormal findings of blood chemistry: Secondary | ICD-10-CM

## 2013-11-22 ENCOUNTER — Other Ambulatory Visit (INDEPENDENT_AMBULATORY_CARE_PROVIDER_SITE_OTHER): Payer: No Typology Code available for payment source

## 2013-11-22 DIAGNOSIS — R946 Abnormal results of thyroid function studies: Secondary | ICD-10-CM

## 2013-11-22 DIAGNOSIS — R7989 Other specified abnormal findings of blood chemistry: Secondary | ICD-10-CM

## 2013-11-22 LAB — T4, FREE: Free T4: 0.9 ng/dL (ref 0.60–1.60)

## 2013-11-22 LAB — TSH: TSH: 0.29 u[IU]/mL — ABNORMAL LOW (ref 0.35–4.50)

## 2014-08-20 ENCOUNTER — Encounter: Payer: Self-pay | Admitting: Internal Medicine

## 2014-08-20 ENCOUNTER — Ambulatory Visit (INDEPENDENT_AMBULATORY_CARE_PROVIDER_SITE_OTHER): Payer: No Typology Code available for payment source | Admitting: Internal Medicine

## 2014-08-20 VITALS — BP 108/76 | HR 91 | Temp 98.3°F | Wt 130.0 lb

## 2014-08-20 DIAGNOSIS — M259 Joint disorder, unspecified: Secondary | ICD-10-CM

## 2014-08-20 DIAGNOSIS — R29898 Other symptoms and signs involving the musculoskeletal system: Secondary | ICD-10-CM

## 2014-08-20 DIAGNOSIS — M2540 Effusion, unspecified joint: Secondary | ICD-10-CM

## 2014-08-20 MED ORDER — PREDNISONE 10 MG PO TABS: ORAL_TABLET | ORAL | Status: DC

## 2014-08-20 NOTE — Patient Instructions (Signed)

## 2014-08-20 NOTE — Progress Notes (Signed)
Pre visit review using our clinic review tool, if applicable. No additional management support is needed unless otherwise documented below in the visit note. 

## 2014-08-20 NOTE — Progress Notes (Signed)
Subjective:    Patient ID: Wendy Hogan, female    DOB: 22-Jan-1976, 39 y.o.   MRN: [id number]  HPI  Pt presents to the clinic today with c/o joint swelling. She noticed this 3 days ago. It is affecting her thumb, pointer finger, pinky finger and wrist on the right hand. She denies any injury to the area. She has tried Ibuprofen and heat without much relief. She has a history of the sickle cell trait but not the disease. She reports she has not had anything like this before.  Review of Systems  Past Medical History  Diagnosis Date  . Non-healing surgical wound     R buttock  . Sickle cell trait   . Anemia   . Sickle cell anemia     trait  . Abscess     gluteal  . Syphilis     Current Outpatient Prescriptions  Medication Sig Dispense Refill  . ferrous fumarate (HEMOCYTE - 106 MG FE) 325 (106 FE) MG TABS Take 1 tablet by mouth daily.    . Multiple Vitamins-Minerals (WOMENS DAILY FORMULA PO) Take 1 capsule by mouth daily.    . nicotine (NICODERM CQ - DOSED IN MG/24 HOURS) 14 mg/24hr patch Place 1 patch (14 mg total) onto the skin daily. 28 patch 0   No current facility-administered medications for this visit.    No Known Allergies  Family History  Problem Relation Age of Onset  . Cancer Maternal Grandfather     colon  . Diabetes Mother   . Sickle cell trait Father   . Diabetes Maternal Aunt   . Diabetes Maternal Grandmother     History   Social History  . Marital Status: Single    Spouse Name: N/A  . Number of Children: N/A  . Years of Education: N/A   Occupational History  . Not on file.   Social History Main Topics  . Smoking status: Current Every Day Smoker -- 0.15 packs/day    Types: Cigarettes  . Smokeless tobacco: Never Used  . Alcohol Use: Yes     Comment: occasionally  . Drug Use: No  . Sexual Activity: Yes    Birth Control/ Protection: None   Other Topics Concern  . Not on file   Social History Narrative  . No narrative on file      Constitutional: Denies fever, malaise, fatigue, headache or abrupt weight changes.  Musculoskeletal: Pt reports joint swelling. Denies decrease in range of motion, difficulty with gait, muscle pain or joint pain.  Skin: Pt reports joint redness. Denies rashes, lesions or ulcercations.  Neurological: Denies dizziness, difficulty with memory, difficulty with speech or problems with balance and coordination.   No other specific complaints in a complete review of systems (except as listed in HPI above).     Objective:   Physical Exam   BP 108/76 mmHg  Pulse 91  Temp(Src) 98.3 F (36.8 C) (Oral)  Wt 130 lb (58.968 kg)  SpO2 99%  LMP 08/03/2014 Wt Readings from Last 3 Encounters:  08/20/14 130 lb (58.968 kg)  10/18/13 132 lb (59.875 kg)  08/26/11 139 lb 12.8 oz (63.413 kg)    General: Appears her stated age, well developed, well nourished in NAD. Skin: Warm, dry and intact. Warmth and redness noted of palmar surface of right hand. Cardiovascular: Normal rate and rhythm. S1,S2 noted.  No murmur, rubs or gallops noted.  Pulmonary/Chest: Normal effort and positive vesicular breath sounds. No respiratory distress. No wheezes, rales or  ronchi noted.  Musculoskeletal: Normal extension of fingers, wrist on right hand. Unable to completely thumb, first, second and pinky finger on right hand. Pain with flexion of the wrist. Hand grips unequal, L>R. Neurological: Alert and oriented. Sensation intact to BUE.   BMET    Component Value Date/Time   NA 137 10/18/2013 1415   K 3.9 10/18/2013 1415   CL 104 10/18/2013 1415   CO2 27 10/18/2013 1415   GLUCOSE 86 10/18/2013 1415   BUN 8 10/18/2013 1415   CREATININE 0.6 10/18/2013 1415   CALCIUM 9.5 10/18/2013 1415   GFRNONAA >90 08/08/2011 0346   GFRAA >90 08/08/2011 0346    Lipid Panel     Component Value Date/Time   CHOL 135 10/18/2013 1415   TRIG 66.0 10/18/2013 1415   HDL 35.00* 10/18/2013 1415   CHOLHDL 4 10/18/2013 1415    VLDL 13.2 10/18/2013 1415   LDLCALC 87 10/18/2013 1415    CBC    Component Value Date/Time   WBC 9.9 10/18/2013 1415   RBC 4.98 10/18/2013 1415   HGB 9.7* 10/18/2013 1415   HCT 30.6* 10/18/2013 1415   PLT 463.0* 10/18/2013 1415   MCV 61.4 Repeated and verified X2.* 10/18/2013 1415   MCH 21.2* 08/08/2011 0346   MCHC 31.6 10/18/2013 1415   RDW 21.0* 10/18/2013 1415   LYMPHSABS 2.7 08/08/2011 0346   MONOABS 1.8* 08/08/2011 0346   EOSABS 0.0 08/08/2011 0346   BASOSABS 0.0 08/08/2011 0346    Hgb A1C Lab Results  Component Value Date   HGBA1C 4.8 10/18/2013        Assessment & Plan:   Joint pain, swelling and redness:  Concerning for gout eRx for pred taper She has a follow up exam in May, will obtain uric acid level at that time Work note provided  RTC in 2 months for your follow up or sooner if pain persists or worsen

## 2014-08-22 ENCOUNTER — Telehealth: Payer: Self-pay | Admitting: Internal Medicine

## 2014-08-22 NOTE — Telephone Encounter (Signed)
Pt called stating her hand is still hurting and she needs a work note  For 08/21/14 and 08/22/14/ and 08/23/14 Please advise when ready for pick up Swelling is down a little and wrist is still painful

## 2014-08-22 NOTE — Telephone Encounter (Signed)
Done and placed in front office for pick up and pt is aware

## 2014-08-22 NOTE — Telephone Encounter (Signed)
Ok for work note? 

## 2015-03-27 ENCOUNTER — Telehealth: Payer: Self-pay | Admitting: Internal Medicine

## 2015-03-27 ENCOUNTER — Other Ambulatory Visit: Payer: Self-pay

## 2015-03-27 ENCOUNTER — Telehealth: Payer: Self-pay

## 2015-03-27 MED ORDER — INDOMETHACIN 50 MG PO CAPS: 50.0000 mg | ORAL_CAPSULE | Freq: Three times a day (TID) | ORAL | Status: DC | PRN

## 2015-03-27 NOTE — Telephone Encounter (Signed)
Indomethacin sent into pharmacy. Left message on patient's cell phone and notified her of this.

## 2015-03-27 NOTE — Telephone Encounter (Signed)
She had Prednisone before. That can not be prescribed without an OV. OK to send in Indomethacin 50 mg TID prn # 30, 2 refills to take for pain and inflammation.

## 2015-03-27 NOTE — Telephone Encounter (Signed)
[facility]  Patient Name: Wendy Hogan  DOB: [birth year]    Initial Comment Caller states pharm didn't have meds for gout. Call the 419 number   Nurse Assessment  Nurse: Luther Parody, RN, Malachy Mood Date/Time Eilene Ghazi Time): 03/27/2015 4:45:28 PM  Confirm and document reason for call. If symptomatic, describe symptoms. ---Caller states that she is having a flare up of her gout and would like a refill on her medication. States that she left a message with the office but has not heard back.  Has the patient traveled out of the country within the last 30 days? ---Not Applicable  Does the patient have any new or worsening symptoms? ---Yes  Will a triage be completed? ---No  Select reason for no triage. ---Patient declined  Please document clinical information provided and list any resource used. ---Advised that I spoke with Jerene Pitch in the office and she said that a message had been sent to her pcp but she is out of the office today and that they will f/u with her tomorrow. Verbalized understanding.     Guidelines    Guideline Title Affirmed Question Affirmed Notes       Final Disposition User

## 2015-03-27 NOTE — Telephone Encounter (Signed)
I received a phone message from patient stating that she needs a Rx refill for her gout. Patient states that it has flared up and she needs some medication for the pain. Please advise.

## 2015-03-27 NOTE — Telephone Encounter (Signed)
[facility] Patient Name: [name] DOB: 02/10/1976 Initial Comment Caller states doctor had given medicine for gout in her right hand but the pharmacy didnt have it. Nurse Assessment Guidelines Guideline Title Affirmed Question Affirmed Notes Final Disposition User FINAL ATTEMPT MADE - message left Dawson, RN, Sherre Poot Comments Unable to reach the pt. Msg sent to her MD.

## 2015-03-28 MED ORDER — INDOMETHACIN 50 MG PO CAPS: 50.0000 mg | ORAL_CAPSULE | Freq: Three times a day (TID) | ORAL | Status: DC | PRN

## 2015-03-28 NOTE — Addendum Note (Signed)
Addended by: Lurlean Nanny on: 03/28/2015 09:34 AM   Modules accepted: Orders

## 2015-03-28 NOTE — Telephone Encounter (Signed)
Rx called in to pharmacy. 

## 2016-04-01 ENCOUNTER — Other Ambulatory Visit: Payer: Self-pay | Admitting: Internal Medicine

## 2016-04-05 ENCOUNTER — Telehealth: Payer: Self-pay | Admitting: Radiology

## 2016-04-05 ENCOUNTER — Encounter: Payer: Self-pay | Admitting: Family Medicine

## 2016-04-05 ENCOUNTER — Ambulatory Visit (INDEPENDENT_AMBULATORY_CARE_PROVIDER_SITE_OTHER): Payer: BLUE CROSS/BLUE SHIELD | Admitting: Family Medicine

## 2016-04-05 VITALS — BP 106/68 | HR 83 | Temp 98.4°F | Ht 64.5 in | Wt 132.8 lb

## 2016-04-05 DIAGNOSIS — M2550 Pain in unspecified joint: Secondary | ICD-10-CM | POA: Insufficient documentation

## 2016-04-05 DIAGNOSIS — D573 Sickle-cell trait: Secondary | ICD-10-CM

## 2016-04-05 DIAGNOSIS — F17200 Nicotine dependence, unspecified, uncomplicated: Secondary | ICD-10-CM | POA: Insufficient documentation

## 2016-04-05 DIAGNOSIS — D509 Iron deficiency anemia, unspecified: Secondary | ICD-10-CM | POA: Insufficient documentation

## 2016-04-05 DIAGNOSIS — D649 Anemia, unspecified: Secondary | ICD-10-CM | POA: Diagnosis not present

## 2016-04-05 LAB — CBC WITH DIFFERENTIAL/PLATELET
BASOS ABS: 0 10*3/uL (ref 0.0–0.1)
Basophils Relative: 0.5 % (ref 0.0–3.0)
EOS PCT: 0.5 % (ref 0.0–5.0)
Eosinophils Absolute: 0.1 10*3/uL (ref 0.0–0.7)
HCT: 25.2 % — ABNORMAL LOW (ref 36.0–46.0)
LYMPHS ABS: 1.9 10*3/uL (ref 0.7–4.0)
Lymphocytes Relative: 19.2 % (ref 12.0–46.0)
MCHC: 30.4 g/dL (ref 30.0–36.0)
MCV: 53.8 fl — ABNORMAL LOW (ref 78.0–100.0)
MONO ABS: 1.2 10*3/uL — AB (ref 0.1–1.0)
Monocytes Relative: 12.2 % — ABNORMAL HIGH (ref 3.0–12.0)
NEUTROS PCT: 67.6 % (ref 43.0–77.0)
Neutro Abs: 6.6 10*3/uL (ref 1.4–7.7)
Platelets: 475 10*3/uL — ABNORMAL HIGH (ref 150.0–400.0)
RBC: 4.68 Mil/uL (ref 3.87–5.11)
RDW: 21.4 % — ABNORMAL HIGH (ref 11.5–15.5)
WBC: 9.8 10*3/uL (ref 4.0–10.5)

## 2016-04-05 LAB — COMPREHENSIVE METABOLIC PANEL
ALT: 8 U/L (ref 0–35)
AST: 13 U/L (ref 0–37)
Albumin: 3.9 g/dL (ref 3.5–5.2)
Alkaline Phosphatase: 87 U/L (ref 39–117)
BILIRUBIN TOTAL: 0.4 mg/dL (ref 0.2–1.2)
BUN: 12 mg/dL (ref 6–23)
CHLORIDE: 103 meq/L (ref 96–112)
CO2: 28 meq/L (ref 19–32)
CREATININE: 0.52 mg/dL (ref 0.40–1.20)
Calcium: 9.4 mg/dL (ref 8.4–10.5)
GFR: 166.29 mL/min (ref >60.00)
GLUCOSE: 114 mg/dL — AB (ref 70–99)
Potassium: 4.2 mEq/L (ref 3.5–5.1)
SODIUM: 136 meq/L (ref 135–145)
Total Protein: 8.3 g/dL (ref 6.0–8.3)

## 2016-04-05 LAB — URIC ACID: URIC ACID, SERUM: 4 mg/dL (ref 2.4–7.0)

## 2016-04-05 LAB — SEDIMENTATION RATE: SED RATE: 41 mm/h — AB (ref 0–20)

## 2016-04-05 MED ORDER — INDOMETHACIN 50 MG PO CAPS: 50.0000 mg | ORAL_CAPSULE | Freq: Three times a day (TID) | ORAL | 1 refills | Status: DC | PRN

## 2016-04-05 NOTE — Progress Notes (Signed)
Subjective:    Patient ID: Wendy Hogan, female    DOB: 1975/11/07, 40 y.o.   MRN: [id number]  HPI  Here for joint pain with susp of gout   Also-her whole body aches for a few days  No fever  Muscles/soft tissues as well as joints   Index finger on L hand  Middle finger on R hand Both swollen - in the middle  Not red or hot - but it has been in the past   Both knees hurt  Worse on the left  No known hx of arthritis A little swelling on the L -no redness or heat today   When she first got gout (suspected)- it was her R thumb- severely painful and tender and red and hot   No auto immune dz in the family   No regular exercise   Just ran out of refills of indocin -it is helpful  She tolerates it ok   Last year gout was suspected-pt was supposed to have a uric acid test (never did?) Was given a prednisone taper    Also wants to check her blood count  [place] trait  Takes iron   Patient Active Problem List   Diagnosis Date Noted  . Multiple joint pain 04/05/2016  . Anemia 04/05/2016  . Sickle cell trait ([place]) 04/05/2016  . Smoker 04/05/2016   Past Medical History:  Diagnosis Date  . Abscess    gluteal  . Anemia   . Non-healing surgical wound    R buttock  . Sickle cell anemia (H[facility])    trait  . Sickle cell trait ([place])   . Syphilis    Past Surgical History:  Procedure Laterality Date  . IRRIGATION AND DEBRIDEMENT ABSCESS     R buttock   Social History  Substance Use Topics  . Smoking status: Current Every Day Smoker    Packs/day: 0.10    Types: Cigarettes  . Smokeless tobacco: Never Used  . Alcohol use 0.0 oz/week     Comment: occasionally   Family History  Problem Relation Age of Onset  . Cancer Maternal Grandfather     colon  . Diabetes Mother   . Sickle cell trait Father   . Diabetes Maternal Aunt   . Diabetes Maternal Grandmother    No Known Allergies Current Outpatient Prescriptions on File Prior to Visit  Medication Sig Dispense  Refill  . ferrous fumarate (HEMOCYTE - 106 MG FE) 325 (106 FE) MG TABS Take 1 tablet by mouth daily.    . Multiple Vitamins-Minerals (WOMENS DAILY FORMULA PO) Take 1 capsule by mouth daily.     No current facility-administered medications on file prior to visit.     Review of Systems    Review of Systems  Constitutional: Negative for fever, appetite change, fatigue and unexpected weight change.  Eyes: Negative for pain and visual disturbance.  Respiratory: Negative for cough and shortness of breath.   Cardiovascular: Negative for cp or palpitations    Gastrointestinal: Negative for nausea, diarrhea and constipation.  Genitourinary: Negative for urgency and frequency.  Skin: Negative for pallor or rash   MSK pos for joint pain/ swelling , pos for muscle pain/ body aches Neurological: Negative for weakness, light-headedness, numbness and headaches.  Hematological: Negative for adenopathy. Does not bruise/bleed easily.  Psychiatric/Behavioral: Negative for dysphoric mood. The patient is not nervous/anxious.      Objective:   Physical Exam  Constitutional: She appears well-developed and well-nourished. No distress.  Well appearing  HENT:  Head: Normocephalic and atraumatic.  Mouth/Throat: Oropharynx is clear and moist.  Eyes: Conjunctivae and EOM are normal. Pupils are equal, round, and reactive to light.  Neck: Normal range of motion. Neck supple. No JVD present. Carotid bruit is not present. No thyromegaly present.  Cardiovascular: Normal rate, regular rhythm, normal heart sounds and intact distal pulses.  Exam reveals no gallop.   Pulmonary/Chest: Effort normal and breath sounds normal. No respiratory distress. She has no wheezes. She has no rales.  No crackles  Mildly distant bs  Abdominal: She exhibits no abdominal bruit.  Musculoskeletal: She exhibits edema and tenderness.  Hands:  L index finger-slt swelling w/o tenderness R middle finger-slt swelling w/o tenderness  No  erythema /warmth of any joints   Pain on knee flex past 90 deg bilat No crepitus or instability No eff or swelling  Nl gait   Lymphadenopathy:    She has no cervical adenopathy.  Neurological: She is alert. She has normal reflexes. No cranial nerve deficit. She exhibits normal muscle tone. Coordination normal.  Skin: Skin is warm and dry. No rash noted. No pallor.  Psychiatric: She has a normal mood and affect.          Assessment & Plan:   Problem List Items Addressed This Visit      Other   Anemia    Pt has [place] trait  Takes iron  Cbc today      Relevant Orders   CBC with Differential/Platelet   Multiple joint pain    With some mild swelling in several fingers  Pt is concerned about gout-in the past had redness Imp with indocin -refilled  Uric acid level today  Also auto immune joint screening tests incl ESR/ ANA and RF xrays may be helpful in the future  F/u planned with PCP       Relevant Orders   CBC with Differential/Platelet   Sedimentation Rate   ANA   Rheumatoid factor   Uric acid   Comprehensive metabolic panel   Sickle cell trait (HCC)    Cbc today      Smoker    Disc in detail risks of smoking and possible outcomes including copd, vascular/ heart disease, cancer , respiratory and sinus infections  Pt voices understanding Pt states she is cutting back but not ready to totally quit

## 2016-04-05 NOTE — Assessment & Plan Note (Signed)
With some mild swelling in several fingers  Pt is concerned about gout-in the past had redness Imp with indocin -refilled  Uric acid level today  Also auto immune joint screening tests incl ESR/ ANA and RF xrays may be helpful in the future  F/u planned with PCP

## 2016-04-05 NOTE — Patient Instructions (Addendum)
If you have gout-it is most important to drink lots and lots of water  Also avoid foods with purines in them  See the handouts I gave you   Take the indomethacin with food when needed Lab today for blood count /uric acid/ and some auto immuno joint labs  Schedule a follow up with Rollene Fare for 2 weeks

## 2016-04-05 NOTE — Assessment & Plan Note (Signed)
Pt has [place] trait  Takes iron  Cbc today

## 2016-04-05 NOTE — Assessment & Plan Note (Signed)
Cbc today

## 2016-04-05 NOTE — Assessment & Plan Note (Signed)
Disc in detail risks of smoking and possible outcomes including copd, vascular/ heart disease, cancer , respiratory and sinus infections  Pt voices understanding Pt states she is cutting back but not ready to totally quit

## 2016-04-05 NOTE — Telephone Encounter (Signed)
Aware- resulted with result note-she is normally anemic but not usually this bad  Will cc her pcp

## 2016-04-05 NOTE — Progress Notes (Signed)
Pre visit review using our clinic review tool, if applicable. No additional management support is needed unless otherwise documented below in the visit note. 

## 2016-04-05 NOTE — Telephone Encounter (Signed)
[facility] called critical results, HGB - 7.7, results given to Dr Glori Bickers

## 2016-04-06 ENCOUNTER — Telehealth: Payer: Self-pay | Admitting: Internal Medicine

## 2016-04-06 ENCOUNTER — Telehealth: Payer: Self-pay | Admitting: Family Medicine

## 2016-04-06 LAB — ANTI-NUCLEAR AB-TITER (ANA TITER)

## 2016-04-06 LAB — ANA: Anti Nuclear Antibody(ANA): POSITIVE — AB

## 2016-04-06 LAB — RHEUMATOID FACTOR: Rhuematoid fact SerPl-aCnc: <14 IU/mL (ref <14)

## 2016-04-06 MED ORDER — FERROUS FUMARATE 325 (106 FE) MG PO TABS: 1.0000 | ORAL_TABLET | Freq: Two times a day (BID) | ORAL | 1 refills | Status: DC

## 2016-04-06 NOTE — Telephone Encounter (Signed)
-----   Message from Tammi Sou, [place] sent at 04/06/2016  3:57 PM EST ----- Pt notified of lab results and Dr. Marliss Coots comments. Pt said that her periods are very heavy and painful and she ran out of her iron so she hasn't been taking it but if you want to prescribed some more iron she uses [facility].

## 2016-04-06 NOTE — Telephone Encounter (Signed)
Left voicemail requesting pt to call the office back 

## 2016-04-06 NOTE — Telephone Encounter (Signed)
Pt returned call regarding lab  Please call [phone] Thank you

## 2016-04-06 NOTE — Telephone Encounter (Signed)
Addressed through result notes  

## 2016-04-06 NOTE — Telephone Encounter (Signed)
I went ahead and sent in her iron for bid (formerly was daily)  Since her count is so low  She needs to get in touch with her gyn regarding the heavy periods -let us know if she needs a referral   Take the iron with food  She may need a stool softener like colace otc prn since iron can constipate  Eat lots of deep leafy greens /dark green vegetables Drink lots of water  Follow up with Rollene Fare (PCP) in 2 weeks or so for a re check

## 2016-04-07 ENCOUNTER — Telehealth: Payer: Self-pay | Admitting: Family Medicine

## 2016-04-07 DIAGNOSIS — M2550 Pain in unspecified joint: Secondary | ICD-10-CM

## 2016-04-07 DIAGNOSIS — R768 Other specified abnormal immunological findings in serum: Secondary | ICD-10-CM

## 2016-04-07 NOTE — Telephone Encounter (Signed)
Pt notified of Dr. Marliss Coots instructions and verbalized understanding. She will get Rx from pharmacy. Pt will call back and schedule her 2 week f/u with Rollene Fare once she looks at her scheduled at work

## 2016-04-07 NOTE — Telephone Encounter (Signed)
Done Will route to [facility] 

## 2016-04-07 NOTE — Telephone Encounter (Signed)
Call pt:  I saw that iron was called in, she needs to take as prescribed. Does she have GYN that she can talk about treatment of her heavy menses or would she rather make a follow up appt with me?

## 2016-04-07 NOTE — Telephone Encounter (Signed)
-----   Message from Tammi Sou, [place] sent at 04/07/2016 12:38 PM EST ----- Pt notified of ANA results and Dr. Marliss Coots comments, pt does agree with referral to rheumatologist. Please put referral in and I advise pt our [facility] will call to schedule her appt

## 2016-04-08 NOTE — Telephone Encounter (Signed)
Looked at previous messages and pt is aware--Left message on voicemail

## 2016-04-13 NOTE — Telephone Encounter (Signed)
Called patient and LMOM

## 2016-04-15 DIAGNOSIS — R5382 Chronic fatigue, unspecified: Secondary | ICD-10-CM | POA: Insufficient documentation

## 2016-04-15 DIAGNOSIS — D75839 Thrombocytosis, unspecified: Secondary | ICD-10-CM | POA: Insufficient documentation

## 2016-04-15 DIAGNOSIS — R7989 Other specified abnormal findings of blood chemistry: Secondary | ICD-10-CM | POA: Insufficient documentation

## 2016-04-15 DIAGNOSIS — R700 Elevated erythrocyte sedimentation rate: Secondary | ICD-10-CM | POA: Insufficient documentation

## 2016-04-15 DIAGNOSIS — M791 Myalgia, unspecified site: Secondary | ICD-10-CM | POA: Insufficient documentation

## 2016-05-03 DIAGNOSIS — E559 Vitamin D deficiency, unspecified: Secondary | ICD-10-CM | POA: Insufficient documentation

## 2016-06-28 ENCOUNTER — Other Ambulatory Visit: Payer: Self-pay | Admitting: Family Medicine

## 2016-06-28 NOTE — Telephone Encounter (Signed)
Routing to PCP pt had an OV with Dr. Glori Bickers about her gout on 04/05/16, last filled on 04/05/16 #30 caps with 1 additional refill

## 2016-07-14 ENCOUNTER — Telehealth: Payer: Self-pay

## 2016-07-14 NOTE — Telephone Encounter (Signed)
Pt left v/m requesting cb; that was entire message. Left v.m requesting pt to cb.

## 2016-07-14 NOTE — Telephone Encounter (Signed)
Pt left v/m requesting refill on gout med; previous phone note on 06/28/16 R baity NP said pt needs appt. Pt will check her work schedule and cb for appt.

## 2016-08-05 ENCOUNTER — Ambulatory Visit: Payer: BLUE CROSS/BLUE SHIELD | Admitting: Internal Medicine

## 2016-08-05 NOTE — Progress Notes (Deleted)
   Subjective:    Patient ID: Wendy Hogan, female    DOB: March 14, 1976, 41 y.o.   MRN: [id number]  HPI  Pt presents to the clinic today with c/o swelling in her hands. This has been an ongoing issue for her. She has been worked up for gout and autoimmune disease. Uric acid level was normal but ANA was positive, 03/2016. She was referred to rheumatology at that time.  Review of Systems      Past Medical History:  Diagnosis Date  . Abscess    gluteal  . Anemia   . Non-healing surgical wound    R buttock  . Sickle cell anemia (HCC)    trait  . Sickle cell trait ([place])   . Syphilis     Current Outpatient Prescriptions  Medication Sig Dispense Refill  . ferrous fumarate (HEMOCYTE - 106 MG FE) 325 (106 Fe) MG TABS tablet Take 1 tablet (106 mg of iron total) by mouth 2 (two) times daily. 60 tablet 1  . indomethacin (INDOCIN) 50 MG capsule Take 1 capsule (50 mg total) by mouth 3 (three) times daily as needed. With food 30 capsule 1  . Multiple Vitamins-Minerals (WOMENS DAILY FORMULA PO) Take 1 capsule by mouth daily.     No current facility-administered medications for this visit.     No Known Allergies  Family History  Problem Relation Age of Onset  . Cancer Maternal Grandfather     colon  . Diabetes Mother   . Sickle cell trait Father   . Diabetes Maternal Aunt   . Diabetes Maternal Grandmother     Social History   Social History  . Marital status: Single    Spouse name: N/A  . Number of children: N/A  . Years of education: N/A   Occupational History  . Not on file.   Social History Main Topics  . Smoking status: Current Every Day Smoker    Packs/day: 0.10    Types: Cigarettes  . Smokeless tobacco: Never Used  . Alcohol use 0.0 oz/week     Comment: occasionally  . Drug use: No  . Sexual activity: Yes    Birth control/ protection: None   Other Topics Concern  . Not on file   Social History Narrative  . No narrative on file     Constitutional:  Denies fever, malaise, fatigue, headache or abrupt weight changes.  HEENT: Denies eye pain, eye redness, ear pain, ringing in the ears, wax buildup, runny nose, nasal congestion, bloody nose, or sore throat. Respiratory: Denies difficulty breathing, shortness of breath, cough or sputum production.   Cardiovascular: Denies chest pain, chest tightness, palpitations or swelling in the hands or feet.  Gastrointestinal: Denies abdominal pain, bloating, constipation, diarrhea or blood in the stool.  GU: Denies urgency, frequency, pain with urination, burning sensation, blood in urine, odor or discharge. Musculoskeletal: Denies decrease in range of motion, difficulty with gait, muscle pain or joint pain and swelling.  Skin: Denies redness, rashes, lesions or ulcercations.  Neurological: Denies dizziness, difficulty with memory, difficulty with speech or problems with balance and coordination.  Psych: Denies anxiety, depression, SI/HI.  No other specific complaints in a complete review of systems (except as listed in HPI above).  Objective:   Physical Exam        Assessment & Plan:

## 2016-08-30 ENCOUNTER — Encounter: Payer: Self-pay | Admitting: Family Medicine

## 2016-08-30 ENCOUNTER — Ambulatory Visit (INDEPENDENT_AMBULATORY_CARE_PROVIDER_SITE_OTHER): Payer: BLUE CROSS/BLUE SHIELD | Admitting: Family Medicine

## 2016-08-30 VITALS — BP 97/70 | HR 81 | Temp 98.7°F | Ht 64.5 in | Wt 134.2 lb

## 2016-08-30 DIAGNOSIS — M659 Synovitis and tenosynovitis, unspecified: Secondary | ICD-10-CM

## 2016-08-30 DIAGNOSIS — M79644 Pain in right finger(s): Secondary | ICD-10-CM

## 2016-08-30 DIAGNOSIS — R768 Other specified abnormal immunological findings in serum: Secondary | ICD-10-CM

## 2016-08-30 MED ORDER — INDOMETHACIN 50 MG PO CAPS: 50.0000 mg | ORAL_CAPSULE | Freq: Three times a day (TID) | ORAL | 2 refills | Status: DC | PRN

## 2016-08-30 MED ORDER — FERROUS FUMARATE 325 (106 FE) MG PO TABS: 1.0000 | ORAL_TABLET | Freq: Two times a day (BID) | ORAL | 1 refills | Status: AC

## 2016-08-30 NOTE — Progress Notes (Signed)
Dr. Frederico Hamman T. Bradley Bostelman, MD, [facility] [place], [address] Phone: [phone] Fax: [phone]  08/30/2016  Patient: Wendy Hogan, MRN: [id number], DOB: 30-Mar-1976, 41 y.o.  Primary Physician:  Webb Silversmith, NP   Chief Complaint  Patient presents with  . Swollen Right Thumb    x 2 weeks   Subjective:   CARSYNN Wendy Hogan is a 41 y.o. very pleasant female patient who presents with the following:  Swollen R thumb:   Ongoing swelling on the R thumb.   Recent Rheum eval with + ANA 1/320, + DS DNA, + ESR, + CRP, Elevated CB-CAB ECD4  She was previously told that the patient might have gout, but known is ever obtained a joint fluid analysis to examine for crystals.  She has some swelling on the first MCP joint today limited motion, and she wanted me to evaluate this.   Past Medical History, Surgical History, Social History, Family History, Problem List, Medications, and Allergies have been reviewed and updated if relevant.  Patient Active Problem List   Diagnosis Date Noted  . Positive ANA (antinuclear antibody) 04/07/2016  . Multiple joint pain 04/05/2016  . Anemia 04/05/2016  . Sickle cell trait ([place]) 04/05/2016  . Smoker 04/05/2016    Past Medical History:  Diagnosis Date  . Abscess    gluteal  . Anemia   . Non-healing surgical wound    R buttock  . Sickle cell anemia (HCC)    trait  . Sickle cell trait ([place])   . Syphilis     Past Surgical History:  Procedure Laterality Date  . IRRIGATION AND DEBRIDEMENT ABSCESS     R buttock    Social History   Social History  . Marital status: Single    Spouse name: N/A  . Number of children: N/A  . Years of education: N/A   Occupational History  . Not on file.   Social History Main Topics  . Smoking status: Current Every Day Smoker    Packs/day: 0.10    Types: Cigarettes  . Smokeless tobacco: Never Used  . Alcohol use 0.0 oz/week   Comment: occasionally  . Drug use: No  . Sexual activity: Yes    Birth control/ protection: None   Other Topics Concern  . Not on file   Social History Narrative  . No narrative on file    Family History  Problem Relation Age of Onset  . Cancer Maternal Grandfather     colon  . Diabetes Mother   . Sickle cell trait Father   . Diabetes Maternal Aunt   . Diabetes Maternal Grandmother     No Known Allergies  Medication list reviewed and updated in full in [place].  GEN: No fevers, chills. Nontoxic. Primarily MSK c/o today. MSK: Detailed in the HPI GI: tolerating PO intake without difficulty Neuro: No numbness, parasthesias, or tingling associated. Otherwise the pertinent positives of the ROS are noted above.   Objective:   BP 97/70   Pulse 81   Temp 98.7 F (37.1 C) (Oral)   Ht 5' 4.5" (1.638 m)   Wt 134 lb 4 oz (60.9 kg)   LMP 08/12/2016   BMI 22.69 kg/m    GEN: WDWN, NAD, Non-toxic, Alert & Oriented x 3 HEENT: Atraumatic, Normocephalic.  Ears and Nose: No external deformity. EXTR: No clubbing/cyanosis/edema NEURO: Normal gait.  PSYCH: Normally interactive. Conversant. Not depressed or anxious appearing.  Calm demeanor.  Left hand is entirely unremarkable.  Right hand, there is some fullness in the dorsum of the wrist and there some limitation with extension and flexion, more in the flexion. Although in radial deviation is more preserved, but modestly impaired.  Digits 2 through 5 are normal without any synovitis NT no synovitis.  First digit has normal flexion and extension strength no swelling at the IP joint, but the MCP joint is dramatically swollen. There is no warmth. There is tenderness with flexion and extension. The UCL and MCL appear intact.  Radiology: No results found.  Assessment and Plan:   Synovitis of right hand  Pain of right thumb  Positive ANA (antinuclear antibody)   Synovitis of the first MCP joint on the right with  possible systemic lupus. I have asked the patient to follow-up with her rheumatologist, and based on the notes, this seems like the time when she would want to see her patient back.  I feel that this is less likely gout, without any known crystal proven disease, and prior uric acid levels have been normal.  Nevertheless, I'm going to give her some indomethacin to try to calm this down.  Follow-up: with Rheumatology  Meds ordered this encounter  Medications  . ferrous fumarate (HEMOCYTE - 106 MG FE) 325 (106 Fe) MG TABS tablet    Sig: Take 1 tablet (106 mg of iron total) by mouth 2 (two) times daily.    Dispense:  60 tablet    Refill:  1  . indomethacin (INDOCIN) 50 MG capsule    Sig: Take 1 capsule (50 mg total) by mouth 3 (three) times daily as needed. With food    Dispense:  50 capsule    Refill:  2   Medications Discontinued During This Encounter  Medication Reason  . ferrous fumarate (HEMOCYTE - 106 MG FE) 325 (106 Fe) MG TABS tablet Reorder  . indomethacin (INDOCIN) 50 MG capsule Reorder   No orders of the defined types were placed in this encounter.   Signed,  Maud Deed. Cloteal Isaacson, MD   Allergies as of 08/30/2016   No Known Allergies     Medication List       Accurate as of 08/30/16  1:43 PM. Always use your most recent med list.          ferrous fumarate 325 (106 Fe) MG Tabs tablet Commonly known as:  HEMOCYTE - 106 mg FE Take 1 tablet (106 mg of iron total) by mouth 2 (two) times daily.   indomethacin 50 MG capsule Commonly known as:  INDOCIN Take 1 capsule (50 mg total) by mouth 3 (three) times daily as needed. With food   WOMENS DAILY FORMULA PO Take 1 capsule by mouth daily.

## 2016-08-30 NOTE — Progress Notes (Signed)
Pre visit review using our clinic review tool, if applicable. No additional management support is needed unless otherwise documented below in the visit note. 

## 2016-09-20 DIAGNOSIS — M328 Other forms of systemic lupus erythematosus: Secondary | ICD-10-CM | POA: Insufficient documentation

## 2017-11-04 ENCOUNTER — Encounter: Payer: Self-pay | Admitting: Emergency Medicine

## 2017-11-04 ENCOUNTER — Emergency Department
Admission: EM | Admit: 2017-11-04 | Discharge: 2017-11-04 | Disposition: A | Discharge status: Home / Self Care | Payer: BLUE CROSS/BLUE SHIELD | Attending: Emergency Medicine | Admitting: Emergency Medicine

## 2017-11-04 ENCOUNTER — Other Ambulatory Visit: Payer: Self-pay

## 2017-11-04 DIAGNOSIS — Z79899 Other long term (current) drug therapy: Secondary | ICD-10-CM | POA: Insufficient documentation

## 2017-11-04 DIAGNOSIS — K529 Noninfective gastroenteritis and colitis, unspecified: Secondary | ICD-10-CM | POA: Diagnosis not present

## 2017-11-04 DIAGNOSIS — R1010 Upper abdominal pain, unspecified: Secondary | ICD-10-CM | POA: Diagnosis present

## 2017-11-04 DIAGNOSIS — F17210 Nicotine dependence, cigarettes, uncomplicated: Secondary | ICD-10-CM | POA: Insufficient documentation

## 2017-11-04 MED ORDER — KETOROLAC TROMETHAMINE 30 MG/ML IJ SOLN
30.0000 mg | Freq: Once | INTRAMUSCULAR | Status: AC
  Administered 2017-11-04: 30 mg via INTRAVENOUS
  Filled 2017-11-04: qty 1

## 2017-11-04 MED ORDER — ONDANSETRON HCL 4 MG/2ML IJ SOLN
INTRAMUSCULAR | Status: AC
  Administered 2017-11-04: 4 mg via INTRAVENOUS
  Filled 2017-11-04: qty 2

## 2017-11-04 MED ORDER — ONDANSETRON 4 MG PO TBDP: 4.0000 mg | ORAL_TABLET | Freq: Three times a day (TID) | ORAL | 0 refills | Status: DC | PRN

## 2017-11-04 MED ORDER — SODIUM CHLORIDE 0.9 % IV BOLUS
1000.0000 mL | Freq: Once | INTRAVENOUS | Status: AC
  Administered 2017-11-04: 1000 mL via INTRAVENOUS

## 2017-11-04 MED ORDER — ONDANSETRON HCL 4 MG/2ML IJ SOLN
4.0000 mg | Freq: Once | INTRAMUSCULAR | Status: AC
  Administered 2017-11-04: 4 mg via INTRAVENOUS

## 2017-11-04 MED ORDER — LOPERAMIDE HCL 2 MG PO CAPS
4.0000 mg | ORAL_CAPSULE | Freq: Once | ORAL | Status: AC
  Administered 2017-11-04: 4 mg via ORAL
  Filled 2017-11-04: qty 2

## 2017-11-04 NOTE — ED Triage Notes (Signed)
Pt ambulatory to triage with no distress noted. Pt reports after eating at [employer] she developed upper abd cramping and bloating pain. Reports n/v/d x 3a.

## 2017-11-04 NOTE — ED Notes (Signed)
Pt reported going to [place] and shortly after she had nausea, vomiting, and diarrhea. Pt is alert and oriented x 4. Family at the bedside. Dr. Owens Shark at bedside

## 2017-11-04 NOTE — ED Provider Notes (Signed)
[facility] Emergency Department Provider Note    First MD Initiated Contact with Patient 11/04/17 [phone]     (approximate)  I have reviewed the triage vital signs and the nursing notes.   HISTORY  Chief Complaint Abdominal Pain; Emesis; and Diarrhea    HPI Wendy Hogan is a 42 y.o. female presents to the emergency department history of acute onset of nausea vomiting diarrhea and mid upper abdominal discomfort after eating at [place].  Patient states that symptoms occurred shortly thereafter.  Patient denies any fever afebrile on presentation.   Past Medical History:  Diagnosis Date  . Abscess    gluteal  . Anemia   . Non-healing surgical wound    R buttock  . Sickle cell anemia (HCC)    trait  . Sickle cell trait ([place])   . Syphilis     Patient Active Problem List   Diagnosis Date Noted  . Positive ANA (antinuclear antibody) 04/07/2016  . Multiple joint pain 04/05/2016  . Anemia 04/05/2016  . Sickle cell trait ([place]) 04/05/2016  . Smoker 04/05/2016    Past Surgical History:  Procedure Laterality Date  . IRRIGATION AND DEBRIDEMENT ABSCESS     R buttock    Prior to Admission medications   Medication Sig Start Date End Date Taking? Authorizing Provider  ferrous fumarate (HEMOCYTE - 106 MG FE) 325 (106 Fe) MG TABS tablet Take 1 tablet (106 mg of iron total) by mouth 2 (two) times daily. 08/30/16   Copland, Frederico Hamman, MD  indomethacin (INDOCIN) 50 MG capsule Take 1 capsule (50 mg total) by mouth 3 (three) times daily as needed. With food 08/30/16   Copland, Frederico Hamman, MD  Multiple Vitamins-Minerals (WOMENS DAILY FORMULA PO) Take 1 capsule by mouth daily.    [provider]    Allergies No known drug allergies  Family History  Problem Relation Age of Onset  . Cancer Maternal Grandfather        colon  . Diabetes Mother   . Sickle cell trait Father   . Diabetes Maternal Aunt   . Diabetes Maternal Grandmother     Social  History Social History   Tobacco Use  . Smoking status: Current Every Day Smoker    Packs/day: 0.10    Types: Cigarettes  . Smokeless tobacco: Never Used  Substance Use Topics  . Alcohol use: Yes    Alcohol/week: 0.0 oz    Comment: occasionally  . Drug use: No    Review of Systems Constitutional: No fever/chills Eyes: No visual changes. ENT: No sore throat. Cardiovascular: Denies chest pain. Respiratory: Denies shortness of breath. Gastrointestinal: Positive for nausea vomiting diarrhea and epigastric pain Genitourinary: Negative for dysuria. Musculoskeletal: Negative for neck pain.  Negative for back pain. Integumentary: Negative for rash. Neurological: Negative for headaches, focal weakness or numbness.   ____________________________________________   PHYSICAL EXAM:  VITAL SIGNS: ED Triage Vitals  Enc Vitals Group     BP 11/04/17 0345 104/62     Pulse Rate 11/04/17 0345 88     Resp 11/04/17 0345 18     Temp 11/04/17 0345 98.8 F (37.1 C)     Temp Source 11/04/17 0345 Oral     SpO2 11/04/17 0345 100 %     Weight 11/04/17 0341 56.7 kg (125 lb)     Height 11/04/17 0341 1.651 m (5\' 5" )     Head Circumference --      Peak Flow --      Pain  Score 11/04/17 0340 10     Pain Loc --      Pain Edu? --      Excl. in [place]? --     Constitutional: Alert and oriented. Well appearing and in no acute distress. Eyes: Conjunctivae are normal.  Head: Atraumatic. Mouth/Throat: Mucous membranes are moist.  Oropharynx non-erythematous. Neck: No stridor.  Cardiovascular: Normal rate, regular rhythm. Good peripheral circulation. Grossly normal heart sounds. Respiratory: Normal respiratory effort.  No retractions. Lungs CTAB. Gastrointestinal: Soft and nontender. No distention.  Musculoskeletal: No lower extremity tenderness nor edema. No gross deformities of extremities. Neurologic:  Normal speech and language. No gross focal neurologic deficits are appreciated.  Skin:  Skin is  warm, dry and intact. No rash noted. Psychiatric: Mood and affect are normal. Speech and behavior are normal.      Procedures   ____________________________________________   INITIAL IMPRESSION / ASSESSMENT AND PLAN / ED COURSE  As part of my medical decision making, I reviewed the following data within the electronic MEDICAL RECORD NUMBER  42 year old female presented with above-stated history and physical exam raising concern for possible gastroenteritis given absence of abdominal pain with palpation.  Patient given IV Zofran 4 mg 1 L IV normal saline and Imodium 4 mg by mouth ____________________________________________  FINAL CLINICAL IMPRESSION(S) / ED DIAGNOSES  Final diagnoses:  Gastroenteritis     MEDICATIONS GIVEN DURING THIS VISIT:  Medications  ondansetron (ZOFRAN) 4 MG/2ML injection (has no administration in time range)     ED Discharge Orders    None       Note:  This document was prepared using Dragon voice recognition software and may include unintentional dictation errors.    Gregor Hams, MD 11/04/17 [phone]

## 2018-01-02 ENCOUNTER — Other Ambulatory Visit: Payer: Self-pay | Admitting: Internal Medicine

## 2018-01-04 ENCOUNTER — Ambulatory Visit: Payer: BLUE CROSS/BLUE SHIELD | Admitting: Internal Medicine

## 2018-01-04 ENCOUNTER — Encounter: Payer: Self-pay | Admitting: Internal Medicine

## 2018-01-04 VITALS — BP 102/64 | HR 97 | Temp 99.2°F | Wt 136.0 lb

## 2018-01-04 DIAGNOSIS — N6320 Unspecified lump in the left breast, unspecified quadrant: Secondary | ICD-10-CM | POA: Diagnosis not present

## 2018-01-04 NOTE — Patient Instructions (Signed)
Breast Cyst A breast cyst is a sac in the breast that is filled with fluid. Breast cysts are usually noncancerous (benign). They are common among women, and they are most often located in the upper, outer portion of the breast. One or more cysts may develop. They form when fluid builds up inside of the breast glands. There are several types of breast cysts:  Macrocyst. This is a cyst that is about 2 inches (5.1 cm) across (in diameter).  Microcyst. This is a very small cyst that you cannot feel, but it can be seen with imaging tests such as an X-ray of the breast (mammogram) or ultrasound.  Galactocele. This is a cyst that contains milk. It may develop if you suddenly stop breastfeeding.  Breast cysts do not increase your risk of breast cancer. They usually disappear after menopause, unless you take artificial hormones (are on hormone therapy). What are the causes? The exact cause of breast cysts is not known. Possible causes include:  Blockage of tubes (ducts) in the breast glands, which leads to fluid buildup. Duct blockage may result from: ? Fibrocystic breast changes. This is a common, benign condition that occurs when women go through hormonal changes during the menstrual cycle. This is a common cause of multiple breast cysts. ? Overgrowth of breast tissue or breast glands. ? Scar tissue in the breast from previous surgery.  Changes in certain female hormones (estrogen and progesterone).  What increases the risk? You may be more likely to develop breast cysts if you have not gone through menopause. What are the signs or symptoms? Symptoms of a breast cyst may include:  Feeling one or more smooth, round, soft lumps (like grapes) in the breast that are easily moveable. The lump(s) may get bigger and more painful before your period and get smaller after your period.  Breast discomfort or pain.  How is this diagnosed? A cyst can be felt during a physical exam by your health care  provider. A mammogram and ultrasound will be done to confirm the diagnosis. Fluid may be removed from the cyst with a needle (fine-needle aspiration) and tested to make sure the cyst is not cancerous. How is this treated? Treatment may not be necessary. Your health care provider may monitor the cyst to see if it goes away on its own. If the cyst is uncomfortable or gets bigger, or if you do not like how the cyst makes your breast look, you may need treatment. Treatment may include:  Hormone treatment.  Fine-needle aspiration, to drain fluid from the cyst. There is a chance of the cyst coming back (recurring) after aspiration.  Surgery to remove the cyst.  Follow these instructions at home:  See your health care provider regularly. ? Get a yearly physical exam. ? If you are 20-40 years old, get a clinical breast exam every 1-3 years. After age 40, get this exam every year. ? Get mammograms as often as directed.  Do a breast self-exam every month, or as often as directed. Having many breast cysts, or "lumpy" breasts, may make it harder to feel for new lumps. Understand how your breasts normally look and feel, and write down any changes in your breasts so you can tell your health care provider about the changes. A breast self-exam involves: ? Comparing your breasts in the mirror. ? Looking for visible changes in your skin or nipples. ? Feeling for lumps or changes.  Take over-the-counter and prescription medicines only as told by your health care   provider.  Wear a supportive bra, especially when exercising.  Follow instructions from your health care provider about eating and drinking restrictions. ? Avoid caffeine. ? Cut down on salt (sodium) in what you eat and drink, especially before your menstrual period. Too much sodium can cause fluid buildup (retention), breast swelling, and discomfort.  Keep all follow-up visits as told your health care provider. This is important. Contact a  health care provider if:  You feel, or think you feel, a lump in your breast.  You notice that both breasts look or feel different than usual.  Your breast is still causing pain after your menstrual period is over.  You find new lumps or bumps that were not there before.  You feel lumps in your armpit (axilla). Get help right away if:  You have severe pain, tenderness, redness, or warmth in your breast.  You have fluid or blood leaking from your nipple.  Your breast lump becomes hard and painful.  You notice dimpling or wrinkling of the breast or nipple. This information is not intended to replace advice given to you by your health care provider. Make sure you discuss any questions you have with your health care provider. Document Released: 05/17/2005 Document Revised: 02/06/2016 Document Reviewed: 02/06/2016 Elsevier Interactive Patient Education  2017 Elsevier Inc.  

## 2018-01-04 NOTE — Progress Notes (Signed)
Subjective:    Patient ID: Wendy Hogan, female    DOB: 01/15/1976, 42 y.o.   MRN: [id number]  HPI  Pt presents to the clinic today with c/o a lump of her left breast. She noticed this 2 days ago. The lump has not gotten bigger in size. It is not painful, itchy or red. She denies discharge from the nipple. She does self breast exams every 1-2 months. She has never had a mammogram. She has no family history of breast cancer.  Review of Systems      Past Medical History:  Diagnosis Date  . Abscess    gluteal  . Anemia   . Non-healing surgical wound    R buttock  . Sickle cell anemia (H[facility])    trait  . Sickle cell trait ([place])   . Syphilis     Current Outpatient Medications  Medication Sig Dispense Refill  . ferrous fumarate (HEMOCYTE - 106 MG FE) 325 (106 Fe) MG TABS tablet Take 1 tablet (106 mg of iron total) by mouth 2 (two) times daily. 60 tablet 1  . indomethacin (INDOCIN) 50 MG capsule Take 1 capsule (50 mg total) by mouth 3 (three) times daily as needed. With food 50 capsule 2  . Multiple Vitamins-Minerals (WOMENS DAILY FORMULA PO) Take 1 capsule by mouth daily.    . ondansetron (ZOFRAN ODT) 4 MG disintegrating tablet Take 1 tablet (4 mg total) by mouth every 8 (eight) hours as needed for nausea or vomiting. 20 tablet 0   No current facility-administered medications for this visit.     No Known Allergies  Family History  Problem Relation Age of Onset  . Cancer Maternal Grandfather        colon  . Diabetes Mother   . Sickle cell trait Father   . Diabetes Maternal Aunt   . Diabetes Maternal Grandmother     Social History   Socioeconomic History  . Marital status: Single    Spouse name: Not on file  . Number of children: Not on file  . Years of education: Not on file  . Highest education level: Not on file  Occupational History  . Not on file  Social Needs  . Financial resource strain: Not on file  . Food insecurity:    Worry: Not on file   Inability: Not on file  . Transportation needs:    Medical: Not on file    Non-medical: Not on file  Tobacco Use  . Smoking status: Current Every Day Smoker    Packs/day: 0.10    Types: Cigarettes  . Smokeless tobacco: Never Used  Substance and Sexual Activity  . Alcohol use: Yes    Alcohol/week: 0.0 oz    Comment: occasionally  . Drug use: No  . Sexual activity: Yes    Birth control/protection: None  Lifestyle  . Physical activity:    Days per week: Not on file    Minutes per session: Not on file  . Stress: Not on file  Relationships  . Social connections:    Talks on phone: Not on file    Gets together: Not on file    Attends religious service: Not on file    Active member of club or organization: Not on file    Attends meetings of clubs or organizations: Not on file    Relationship status: Not on file  . Intimate partner violence:    Fear of current or ex partner: Not on file  Emotionally abused: Not on file    Physically abused: Not on file    Forced sexual activity: Not on file  Other Topics Concern  . Not on file  Social History Narrative  . Not on file     Constitutional: Denies fever, malaise, fatigue, headache or abrupt weight changes.  Skin: Pt reports lump of left breast. Denies redness, rashes, lesions or ulcercations.    No other specific complaints in a complete review of systems (except as listed in HPI above).  Objective:   Physical Exam  BP 102/64   Pulse 97   Temp 99.2 F (37.3 C) (Oral)   Wt 136 lb (61.7 kg)   LMP 01/03/2018   SpO2 99%   BMI 22.63 kg/m  Wt Readings from Last 3 Encounters:  01/04/18 136 lb (61.7 kg)  11/04/17 125 lb (56.7 kg)  08/30/16 134 lb 4 oz (60.9 kg)    General: Appears her stated age, well developed, well nourished in NAD. Skin: Warm, dry and intact. Breast symmetrical. Small, pea size hard but smooth mobile mass noted at 2 O'clock, left breast. No discharge noted from the nipples. Cardiovascular: Normal  rate and rhythm.  Pulmonary/Chest: Normal effort and positive vesicular breath sounds. No respiratory distress. No wheezes, rales or ronchi noted.  Neurological: Alert and oriented.  Psychiatric: Mood and affect normal. Behavior is normal. Judgment and thought content normal.    BMET    Component Value Date/Time   NA 136 04/05/2016 1218   K 4.2 04/05/2016 1218   CL 103 04/05/2016 1218   CO2 28 04/05/2016 1218   GLUCOSE 114 (H) 04/05/2016 1218   BUN 12 04/05/2016 1218   CREATININE 0.52 04/05/2016 1218   CALCIUM 9.4 04/05/2016 1218   GFRNONAA >90 08/08/2011 0346   GFRAA >90 08/08/2011 0346    Lipid Panel     Component Value Date/Time   CHOL 135 10/18/2013 1415   TRIG 66.0 10/18/2013 1415   HDL 35.00 (L) 10/18/2013 1415   CHOLHDL 4 10/18/2013 1415   VLDL 13.2 10/18/2013 1415   LDLCALC 87 10/18/2013 1415    CBC    Component Value Date/Time   WBC 9.8 04/05/2016 1218   RBC 4.68 04/05/2016 1218   HGB 7.7 Repeated and verified X2. (LL) 04/05/2016 1218   HCT 25.2 (L) 04/05/2016 1218   PLT 475.0 (H) 04/05/2016 1218   MCV 53.8 (L) 04/05/2016 1218   MCH 21.2 (L) 08/08/2011 0346   MCHC 30.4 04/05/2016 1218   RDW 21.4 (H) 04/05/2016 1218   LYMPHSABS 1.9 04/05/2016 1218   MONOABS 1.2 (H) 04/05/2016 1218   EOSABS 0.1 04/05/2016 1218   BASOSABS 0.0 04/05/2016 1218    Hgb A1C Lab Results  Component Value Date   HGBA1C 4.8 10/18/2013            Assessment & Plan:   Mass of Left Breast:  Will obtain diagnostic mammogram and bilateral ultrasounds of breast for further evaluation  Will follow up with you once the results are back Webb Silversmith, NP

## 2018-01-17 ENCOUNTER — Ambulatory Visit
Admission: RE | Admit: 2018-01-17 | Discharge: 2018-01-17 | Disposition: A | Discharge status: Home / Self Care | Payer: BLUE CROSS/BLUE SHIELD | Source: Ambulatory Visit | Attending: Internal Medicine | Admitting: Internal Medicine

## 2018-01-17 DIAGNOSIS — N6320 Unspecified lump in the left breast, unspecified quadrant: Secondary | ICD-10-CM

## 2018-05-08 ENCOUNTER — Ambulatory Visit: Payer: BLUE CROSS/BLUE SHIELD | Admitting: Internal Medicine

## 2018-06-01 ENCOUNTER — Encounter: Payer: BLUE CROSS/BLUE SHIELD | Admitting: Internal Medicine

## 2018-06-01 DIAGNOSIS — Z0289 Encounter for other administrative examinations: Secondary | ICD-10-CM

## 2018-06-01 NOTE — Progress Notes (Deleted)
Subjective:    Patient ID: Wendy Hogan, female    DOB: 04/17/1976, 43 y.o.   MRN: [id number]  HPI  Pt presents to the clinic today for her annual exam.  Flu: Tetanus: Pap Smear: Vision Screening: Dentist:  Diet: Exercise:  Review of Systems      Past Medical History:  Diagnosis Date  . Abscess    gluteal  . Anemia   . Non-healing surgical wound    R buttock  . Sickle cell anemia (HCC)    trait  . Sickle cell trait ([place])   . Syphilis     Current Outpatient Medications  Medication Sig Dispense Refill  . ferrous fumarate (HEMOCYTE - 106 MG FE) 325 (106 Fe) MG TABS tablet Take 1 tablet (106 mg of iron total) by mouth 2 (two) times daily. 60 tablet 1  . Multiple Vitamins-Minerals (WOMENS DAILY FORMULA PO) Take 1 capsule by mouth daily.     No current facility-administered medications for this visit.     No Known Allergies  Family History  Problem Relation Age of Onset  . Diabetes Mother   . Sickle cell trait Father   . Cancer Maternal Grandfather        colon  . Diabetes Maternal Aunt   . Diabetes Maternal Grandmother   . Breast cancer Neg Hx     Social History   Socioeconomic History  . Marital status: Single    Spouse name: Not on file  . Number of children: Not on file  . Years of education: Not on file  . Highest education level: Not on file  Occupational History  . Not on file  Social Needs  . Financial resource strain: Not on file  . Food insecurity:    Worry: Not on file    Inability: Not on file  . Transportation needs:    Medical: Not on file    Non-medical: Not on file  Tobacco Use  . Smoking status: Current Every Day Smoker    Packs/day: 0.10    Types: Cigarettes  . Smokeless tobacco: Never Used  Substance and Sexual Activity  . Alcohol use: Yes    Alcohol/week: 0.0 standard drinks    Comment: occasionally  . Drug use: No  . Sexual activity: Yes    Birth control/protection: None  Lifestyle  . Physical activity:   Days per week: Not on file    Minutes per session: Not on file  . Stress: Not on file  Relationships  . Social connections:    Talks on phone: Not on file    Gets together: Not on file    Attends religious service: Not on file    Active member of club or organization: Not on file    Attends meetings of clubs or organizations: Not on file    Relationship status: Not on file  . Intimate partner violence:    Fear of current or ex partner: Not on file    Emotionally abused: Not on file    Physically abused: Not on file    Forced sexual activity: Not on file  Other Topics Concern  . Not on file  Social History Narrative  . Not on file     Constitutional: Denies fever, malaise, fatigue, headache or abrupt weight changes.  HEENT: Denies eye pain, eye redness, ear pain, ringing in the ears, wax buildup, runny nose, nasal congestion, bloody nose, or sore throat. Respiratory: Denies difficulty breathing, shortness of breath, cough or sputum production.  Cardiovascular: Denies chest pain, chest tightness, palpitations or swelling in the hands or feet.  Gastrointestinal: Denies abdominal pain, bloating, constipation, diarrhea or blood in the stool.  GU: Denies urgency, frequency, pain with urination, burning sensation, blood in urine, odor or discharge. Musculoskeletal: Denies decrease in range of motion, difficulty with gait, muscle pain or joint pain and swelling.  Skin: Denies redness, rashes, lesions or ulcercations.  Neurological: Denies dizziness, difficulty with memory, difficulty with speech or problems with balance and coordination.  Psych: Denies anxiety, depression, SI/HI.  No other specific complaints in a complete review of systems (except as listed in HPI above).  Objective:   Physical Exam        Assessment & Plan:

## 2018-11-27 ENCOUNTER — Ambulatory Visit (INDEPENDENT_AMBULATORY_CARE_PROVIDER_SITE_OTHER): Payer: BLUE CROSS/BLUE SHIELD | Admitting: Family Medicine

## 2018-11-27 ENCOUNTER — Encounter: Payer: Self-pay | Admitting: Family Medicine

## 2018-11-27 DIAGNOSIS — H1031 Unspecified acute conjunctivitis, right eye: Secondary | ICD-10-CM

## 2018-11-27 MED ORDER — POLYMYXIN B-TRIMETHOPRIM 10000-0.1 UNIT/ML-% OP SOLN: 1.0000 [drp] | Freq: Four times a day (QID) | OPHTHALMIC | 0 refills | Status: AC

## 2018-11-27 NOTE — Progress Notes (Signed)
I connected with Wendy Hogan on 11/27/18 at 11:20 AM EDT by video and verified that I am speaking with the correct person using two identifiers.   I discussed the limitations, risks, security and privacy concerns of performing an evaluation and management service by video and the availability of in person appointments. I also discussed with the patient that there may be a patient responsible charge related to this service. The patient expressed understanding and agreed to proceed.  Patient location: Home Provider Location: [place] [facility] Participants: Wendy Hogan and Wendy Hogan   Subjective:     Wendy Hogan is a 43 y.o. female presenting for Eye Problem (sx started on 11/26/2018. Eye watery, redness, wakes up with crusted, some white crust at the corners of eye. Right eye.)     Eye Problem  The right eye is affected. The current episode started yesterday. There was no injury mechanism. The pain is at a severity of 0/10. The patient is experiencing no pain. There is known exposure to pink eye. She does not wear contacts. Associated symptoms include an eye discharge, eye redness and itching. Pertinent negatives include no blurred vision, fever, foreign body sensation, nausea, photophobia, recent URI or vomiting. She has tried nothing for the symptoms.   Exposure to pink eye - the patient was already being treated for pink eye    Review of Systems  Constitutional: Negative for fever.  Eyes: Positive for discharge, redness and itching. Negative for blurred vision and photophobia.  Gastrointestinal: Negative for nausea and vomiting.     Social History   Tobacco Use  Smoking Status Current Every Day Smoker  . Packs/day: 0.10  . Types: Cigarettes  Smokeless Tobacco Never Used        Objective:   BP Readings from Last 3 Encounters:  01/04/18 102/64  11/04/17 104/70  08/30/16 97/70   Wt Readings from Last 3 Encounters:  01/04/18 136 lb  (61.7 kg)  11/04/17 125 lb (56.7 kg)  08/30/16 134 lb 4 oz (60.9 kg)   LMP 11/01/2018    Physical Exam Constitutional:      Appearance: Normal appearance. She is not ill-appearing.  HENT:     Head: Normocephalic and atraumatic.     Right Ear: External ear normal.     Left Ear: External ear normal.  Eyes:     General:        Right eye: No discharge.     Extraocular Movements: Extraocular movements intact.     Comments: Very mild conjunctiva redness, primarily clear  Pulmonary:     Effort: Pulmonary effort is normal. No respiratory distress.  Neurological:     Mental Status: She is alert. Mental status is at baseline.  Psychiatric:        Mood and Affect: Mood normal.        Behavior: Behavior normal.        Thought Content: Thought content normal.        Judgment: Judgment normal.              Assessment & Plan:   Problem List Items Addressed This Visit      Other   Acute conjunctivitis of right eye - Primary    Discussed that given some improvement today likely viral. Though given exposure will prescribe Abx to start if symptoms not improving.       Relevant Medications   trimethoprim-polymyxin b (POLYTRIM) ophthalmic solution       Return  if symptoms worsen or fail to improve.  Wendy Noe, MD

## 2018-11-27 NOTE — Assessment & Plan Note (Signed)
Discussed that given some improvement today likely viral. Though given exposure will prescribe Abx to start if symptoms not improving.

## 2019-07-09 ENCOUNTER — Ambulatory Visit: Payer: Medicaid Other | Attending: Internal Medicine

## 2019-07-09 DIAGNOSIS — Z20822 Contact with and (suspected) exposure to covid-19: Secondary | ICD-10-CM | POA: Diagnosis not present

## 2019-07-10 LAB — NOVEL CORONAVIRUS, NAA: SARS-CoV-2, NAA: DETECTED — AB

## 2019-07-31 DIAGNOSIS — E559 Vitamin D deficiency, unspecified: Secondary | ICD-10-CM | POA: Diagnosis not present

## 2019-07-31 DIAGNOSIS — Z79899 Other long term (current) drug therapy: Secondary | ICD-10-CM | POA: Diagnosis not present

## 2019-07-31 DIAGNOSIS — M3219 Other organ or system involvement in systemic lupus erythematosus: Secondary | ICD-10-CM | POA: Diagnosis not present

## 2019-07-31 DIAGNOSIS — D573 Sickle-cell trait: Secondary | ICD-10-CM | POA: Diagnosis not present

## 2019-09-12 NOTE — Progress Notes (Signed)
New patient visit     Patient: Wendy Hogan   DOB: [birth year]   44 y.o. Female  MRN: [id number] Visit Date: 09/12/2019  Today's healthcare provider: Marcille Buffy, FNP   Clint Bolder as a scribe for [place], FNP.,have documented all relevant documentation on the behalf of Marcille Buffy, FNP,as directed by  Marcille Buffy, FNP while in the presence of Marcille Buffy, FNP   Subjective:    Chief Complaint  Patient presents with  . New Patient (Initial Visit)    Wendy Hogan is a 44 y.o. female who presents today as a new patient to establish care.  HPI  Patient states that she feels well today and would like to address need for referral to dermatology. Patient reports that she has a history of eczema and states that she currently has flare up around her eyes.  Patient reports that she follows a well balanced diet, is exercising weekly and states on average she sleeps up to 6hrs a night. Patient reports difficulty falling asleep but denies waking up at night.   Patient reports that she believes that she is due for her yearly mammogram and pap examination, patient states that last examinations were in 2020 and states that results from both pap and mammogram were normal.  Patient  denies any fever, body aches,chills, rash, chest pain, shortness of breath, nausea, vomiting, or diarrhea.    She sees Dr. Lezlie Lye - Meda Coffee for systemic lupus and also has history of Vitamin D deficiency. She is on Plaquenil 200 mg BID. She has been treated with Vitamin D in the past. Noted sickle cell trait- needs hematology refferal per  DR. Bocks nore.   Reviewed results of last mammogram and left breast ultrasound as below.  12/2017 Diagnostic Mammogram  IMPRESSION: No evidence of malignancy within either breast. Benign simple cyst within the superficial LEFT breast at the 1 o'clock axis, measuring 4 mm, corresponding to the  area of clinical concern described by the ordering physician.  RECOMMENDATION: 1.  Screening mammogram in one year.(Code:SM-B-01Y) 2. Benign causes of breast pain, and possible remedies, were discussed with the patient. IMPRESSION: No evidence of malignancy within either breast. Benign simple cyst within the superficial LEFT breast at the 1 o'clock axis, measuring 4 mm, corresponding to the area of clinical concern described by the ordering physician.  01/17/2018 Breast left ultrasound RECOMMENDATION: 1.  Screening mammogram in one year.(Code:SM-B-01Y) 2. Benign causes of breast pain, and possible remedies, were discussed with the patient.  Last PAP smear noted in labs was 2014 and documented as normal.  Patient reports that she went to [facility] in 2020 for eye exam and reports that is was normal. Patient was counseled today on smoking cessation, patient reports that she has been smoking for eight years between 5-6 cigerrates a day, patient denies history of quitting. Discussed with patient about medications offered to help with smoking cessation and she was counseled on nicotrol as well.   Patient  denies any fever, body aches,chills, chest pain, shortness of breath, nausea, vomiting, or diarrhea.   Past Medical History:  Diagnosis Date  . Abscess    gluteal  . Anemia   . Non-healing surgical wound    R buttock  . Sickle cell anemia (HCC)    trait  . Sickle cell trait ([place])   . Syphilis    Past Surgical History:  Procedure Laterality Date  . IRRIGATION AND DEBRIDEMENT ABSCESS  R buttock   Family Status  Relation Name Status  . Mother  Alive  . Father  Deceased  . MGF  (Not Specified)  . Mat Aunt  (Not Specified)  . MGM  (Not Specified)  . Neg Hx  (Not Specified)   Family History  Problem Relation Age of Onset  . Diabetes Mother   . Sickle cell trait Father   . Cancer Maternal Grandfather        colon  . Diabetes Maternal Aunt   . Diabetes Maternal  Grandmother   . Breast cancer Neg Hx    Social History   Socioeconomic History  . Marital status: Single    Spouse name: Not on file  . Number of children: Not on file  . Years of education: Not on file  . Highest education level: Not on file  Occupational History  . Not on file  Tobacco Use  . Smoking status: Current Every Day Smoker    Packs/day: 0.10    Types: Cigarettes  . Smokeless tobacco: Never Used  Substance and Sexual Activity  . Alcohol use: Yes    Alcohol/week: 0.0 standard drinks    Comment: occasionally  . Drug use: No  . Sexual activity: Yes    Birth control/protection: None  Other Topics Concern  . Not on file  Social History Narrative  . Not on file   Social Determinants of Health   Financial Resource Strain:   . Difficulty of Paying Living Expenses:   Food Insecurity:   . Worried About Charity fundraiser in the Last Year:   . Arboriculturist in the Last Year:   Transportation Needs:   . Film/video editor (Medical):   Marland Kitchen Lack of Transportation (Non-Medical):   Physical Activity:   . Days of Exercise per Week:   . Minutes of Exercise per Session:   Stress:   . Feeling of Stress :   Social Connections:   . Frequency of Communication with Friends and Family:   . Frequency of Social Gatherings with Friends and Family:   . Attends Religious Services:   . Active Member of Clubs or Organizations:   . Attends Archivist Meetings:   Marland Kitchen Marital Status:    Outpatient Medications Prior to Visit  Medication Sig  . Calcium Carbonate-Vit D-Min (CALCIUM 1200 PO) Take by mouth daily.  . ferrous fumarate (HEMOCYTE - 106 MG FE) 325 (106 Fe) MG TABS tablet Take 1 tablet (106 mg of iron total) by mouth 2 (two) times daily.  . Multiple Vitamins-Minerals (MULTIVITAMIN GUMMIES ADULTS PO) Take by mouth daily.   No facility-administered medications prior to visit.   No Known Allergies  Patient Care Team: Jearld Fenton, NP as PCP - General  (Internal Medicine)  Review of Systems  Constitutional: Negative.   Respiratory: Negative.   Cardiovascular: Negative.   Gastrointestinal: Negative.   Genitourinary: Negative.   Musculoskeletal: Positive for arthralgias. Negative for back pain, gait problem, joint swelling, myalgias, neck pain and neck stiffness.       Lupus sees Dr Meda Coffee   Skin: Positive for color change and rash (periorbital x 3 weeks ). Negative for pallor and wound.  Neurological: Negative.   Psychiatric/Behavioral: Negative.   All other systems reviewed and are negative.      Objective:    There were no vitals taken for this visit. Physical Exam Vitals reviewed. Exam conducted with a chaperone present.  Constitutional:      Appearance:  Normal appearance. She is normal weight.  HENT:     Head: Normocephalic and atraumatic.     Right Ear: Tympanic membrane normal.     Left Ear: Tympanic membrane normal.     Nose: Nose normal.  Eyes:     Extraocular Movements: Extraocular movements intact.     Conjunctiva/sclera: Conjunctivae normal.  Cardiovascular:     Rate and Rhythm: Normal rate and regular rhythm.     Pulses: Normal pulses.     Heart sounds: Normal heart sounds.  Pulmonary:     Effort: Pulmonary effort is normal.     Breath sounds: Normal breath sounds.  Abdominal:     General: Bowel sounds are normal.  Musculoskeletal:        General: Normal range of motion.     Cervical back: Normal range of motion and neck supple.  Skin:    General: Skin is warm and dry.     Capillary Refill: Capillary refill takes less than 2 seconds.     Comments: Peri-  orbital skin bilaterally with  mild darkening, dry, appears allergic. Eyes are watery bilaterally.   Denies injury or trauma.   Neurological:     General: No focal deficit present.     Mental Status: She is alert and oriented to person, place, and time.     Sensory: Sensation is intact.     Motor: Motor function is intact.     Coordination:  Coordination is intact.     Deep Tendon Reflexes: Reflexes are normal and symmetric.  Psychiatric:        Mood and Affect: Mood normal.        Behavior: Behavior normal.        Thought Content: Thought content normal.     No results found for any visits on 09/13/19.    Assessment & Plan:    Microcytic anemia  Vitamin D deficiency  Low TSH level  Sickle cell trait (HCC)  Screening for STD (sexually transmitted disease)  Encounter for routine adult health examination without abnormal findings  Encounter for screening mammogram for malignant neoplasm of breast  Screening for lipid disorders  Screening for diabetes mellitus  Family history of diabetes mellitus  Allergic reaction, initial encounter  Other forms of systemic lupus erythematosus, unspecified organ involvement status ([place])   Orders Placed This Encounter  Procedures  . MM Digital Screening    Order Specific Question:   Reason for Exam (SYMPTOM  OR DIAGNOSIS REQUIRED)    Answer:   breast cancer screening    Order Specific Question:   Is the patient pregnant?    Answer:   No    Order Specific Question:   Preferred imaging location?    Answer:   [facility]  . Comprehensive metabolic panel  . Lipid panel  . TSH  . HIV antibody (with reflex)  . RPR  . Hemoglobin A1c  . Iron, TIBC and Ferritin Panel  . Ambulatory referral to Hematology    Referral Priority:   Routine    Referral Type:   Consultation    Referral Reason:   Specialty Services Required    Requested Specialty:   Oncology    Number of Visits Requested:   1  . Ambulatory referral to Gynecology    Referral Priority:   Routine    Referral Type:   Consultation    Referral Reason:   Specialty Services Required    Requested Specialty:   Gynecology    Number of Visits  Requested:   1  . Ambulatory referral to Dermatology    Referral Priority:   Routine    Referral Type:   Consultation    Referral Reason:   Specialty Services Required     Requested Specialty:   Dermatology    Number of Visits Requested:   1   Patient has yearly eye exams and also has biannual dental exams.  Is recommended to continue this. The diet and lifestyle was discussed. Smoking cessation was discussed but a total of 5 minutes discussing smoking cessation and the importance of decreasing smoking and cessation to reduce cardiovascular events in the future.  Patient verbalized understanding, she is given options of medication treatments and other alternatives to smoking cessation as well as given information in her after visit summary to review and she will let me know whenever we discussed her lab results what she would like to do.  Smoking cessation referral is always an option as well.  Patient is on Plaquenil we will keep this in mind when comes to prescribing smoking cessation.  She is to keep her follow-ups with Dr. Meda Coffee rheumatology for her lupus.  Positive sickle cell trait, will send to hematology for any further work up if needed.   Advised her to come back to the office in 6 months for recheck and can be sooner if needed or if lab results are positive.  Prefer to be referred to gynecology for her gynecological exam, she has requested referral to dermatology given her allergic response in her eyes, it was advised that she start Benadryl at bedtime interaction check was ran with Plaquenil and okay.  She should take as directed on package.  Moisturize skin in this area.  Advised patient call the office or your primary care doctor for an appointment if no improvement within 72 hours or if any symptoms change or worsen at any time  Advised ER or urgent Care if after hours or on weekend. Call 911 for emergency symptoms at any time.Patinet verbalized understanding of all instructions given/reviewed and treatment plan and has no further questions or concerns at this time.      Follow up in 6 months, contact our office back in two weeks if you have not  heard from referral department.     Orders Placed This Encounter  Procedures  . MM Digital Screening    Order Specific Question:   Reason for Exam (SYMPTOM  OR DIAGNOSIS REQUIRED)    Answer:   breast cancer screening    Order Specific Question:   Is the patient pregnant?    Answer:   No    Order Specific Question:   Preferred imaging location?    Answer:   [facility]  . Comprehensive metabolic panel  . Lipid panel  . TSH  . HIV antibody (with reflex)  . RPR  . Hemoglobin A1c  . Iron, TIBC and Ferritin Panel  . Ambulatory referral to Hematology    Referral Priority:   Routine    Referral Type:   Consultation    Referral Reason:   Specialty Services Required    Requested Specialty:   Oncology    Number of Visits Requested:   1  . Ambulatory referral to Gynecology    Referral Priority:   Routine    Referral Type:   Consultation    Referral Reason:   Specialty Services Required    Requested Specialty:   Gynecology    Number of Visits Requested:   1  .  Ambulatory referral to Dermatology    Referral Priority:   Routine    Referral Type:   Consultation    Referral Reason:   Specialty Services Required    Requested Specialty:   Dermatology    Number of Visits Requested:   1  . POCT urinalysis dipstick      Marcille Buffy, [place] [phone] (phone) [phone] (fax)  [place]

## 2019-09-13 ENCOUNTER — Other Ambulatory Visit: Payer: Self-pay

## 2019-09-13 ENCOUNTER — Encounter: Payer: Self-pay | Admitting: Adult Health

## 2019-09-13 ENCOUNTER — Ambulatory Visit (INDEPENDENT_AMBULATORY_CARE_PROVIDER_SITE_OTHER): Payer: Medicaid Other | Admitting: Adult Health

## 2019-09-13 VITALS — BP 100/72 | HR 89 | Temp 96.9°F | Resp 16 | Ht 65.0 in | Wt 152.0 lb

## 2019-09-13 DIAGNOSIS — D573 Sickle-cell trait: Secondary | ICD-10-CM | POA: Diagnosis not present

## 2019-09-13 DIAGNOSIS — Z833 Family history of diabetes mellitus: Secondary | ICD-10-CM

## 2019-09-13 DIAGNOSIS — T7840XA Allergy, unspecified, initial encounter: Secondary | ICD-10-CM

## 2019-09-13 DIAGNOSIS — M328 Other forms of systemic lupus erythematosus: Secondary | ICD-10-CM

## 2019-09-13 DIAGNOSIS — Z131 Encounter for screening for diabetes mellitus: Secondary | ICD-10-CM

## 2019-09-13 DIAGNOSIS — D509 Iron deficiency anemia, unspecified: Secondary | ICD-10-CM | POA: Diagnosis not present

## 2019-09-13 DIAGNOSIS — Z113 Encounter for screening for infections with a predominantly sexual mode of transmission: Secondary | ICD-10-CM

## 2019-09-13 DIAGNOSIS — Z13220 Encounter for screening for lipoid disorders: Secondary | ICD-10-CM

## 2019-09-13 DIAGNOSIS — Z1231 Encounter for screening mammogram for malignant neoplasm of breast: Secondary | ICD-10-CM

## 2019-09-13 DIAGNOSIS — R7989 Other specified abnormal findings of blood chemistry: Secondary | ICD-10-CM

## 2019-09-13 DIAGNOSIS — Z0000 Encounter for general adult medical examination without abnormal findings: Secondary | ICD-10-CM | POA: Diagnosis not present

## 2019-09-13 DIAGNOSIS — J309 Allergic rhinitis, unspecified: Secondary | ICD-10-CM

## 2019-09-13 DIAGNOSIS — E663 Overweight: Secondary | ICD-10-CM

## 2019-09-13 DIAGNOSIS — E559 Vitamin D deficiency, unspecified: Secondary | ICD-10-CM

## 2019-09-13 NOTE — Patient Instructions (Addendum)
Health Maintenance, Female Adopting a healthy lifestyle and getting preventive care are important in promoting health and wellness. Ask your health care provider about:  The right schedule for you to have regular tests and exams.  Things you can do on your own to prevent diseases and keep yourself healthy. What should I know about diet, weight, and exercise? Eat a healthy diet   Eat a diet that includes plenty of vegetables, fruits, low-fat dairy products, and lean protein.  Do not eat a lot of foods that are high in solid fats, added sugars, or sodium. Maintain a healthy weight Body mass index (BMI) is used to identify weight problems. It estimates body fat based on height and weight. Your health care provider can help determine your BMI and help you achieve or maintain a healthy weight. Get regular exercise Get regular exercise. This is one of the most important things you can do for your health. Most adults should:  Exercise for at least 150 minutes each week. The exercise should increase your heart rate and make you sweat (moderate-intensity exercise).  Do strengthening exercises at least twice a week. This is in addition to the moderate-intensity exercise.  Spend less time sitting. Even light physical activity can be beneficial. Watch cholesterol and blood lipids Have your blood tested for lipids and cholesterol at 46 years of age, then have this test every 5 years. Have your cholesterol levels checked more often if:  Your lipid or cholesterol levels are high.  You are older than 46 years of age.  You are at high risk for heart disease. What should I know about cancer screening? Depending on your health history and family history, you may need to have cancer screening at various ages. This may include screening for:  Breast cancer.  Cervical cancer.  Colorectal cancer.  Skin cancer.  Lung cancer. What should I know about heart disease, diabetes, and high blood  pressure? Blood pressure and heart disease  High blood pressure causes heart disease and increases the risk of stroke. This is more likely to develop in people who have high blood pressure readings, are of African descent, or are overweight.  Have your blood pressure checked: ? Every 3-5 years if you are 18-39 years of age. ? Every year if you are 40 years old or older. Diabetes Have regular diabetes screenings. This checks your fasting blood sugar level. Have the screening done:  Once every three years after age 40 if you are at a normal weight and have a low risk for diabetes.  More often and at a younger age if you are overweight or have a high risk for diabetes. What should I know about preventing infection? Hepatitis B If you have a higher risk for hepatitis B, you should be screened for this virus. Talk with your health care provider to find out if you are at risk for hepatitis B infection. Hepatitis C Testing is recommended for:  Everyone born from 1945 through 1965.  Anyone with known risk factors for hepatitis C. Sexually transmitted infections (STIs)  Get screened for STIs, including gonorrhea and chlamydia, if: ? You are sexually active and are younger than 46 years of age. ? You are older than 46 years of age and your health care provider tells you that you are at risk for this type of infection. ? Your sexual activity has changed since you were last screened, and you are at increased risk for chlamydia or gonorrhea. Ask your health care provider if   you are at risk.  Ask your health care provider about whether you are at high risk for HIV. Your health care provider may recommend a prescription medicine to help prevent HIV infection. If you choose to take medicine to prevent HIV, you should first get tested for HIV. You should then be tested every 3 months for as long as you are taking the medicine. Pregnancy  If you are about to stop having your period (premenopausal) and  you may become pregnant, seek counseling before you get pregnant.  Take 400 to 800 micrograms (mcg) of folic acid every day if you become pregnant.  Ask for birth control (contraception) if you want to prevent pregnancy. Osteoporosis and menopause Osteoporosis is a disease in which the bones lose minerals and strength with aging. This can result in bone fractures. If you are 37 years old or older, or if you are at risk for osteoporosis and fractures, ask your health care provider if you should:  Be screened for bone loss.  Take a calcium or vitamin D supplement to lower your risk of fractures.  Be given hormone replacement therapy (HRT) to treat symptoms of menopause. Follow these instructions at home: Lifestyle  Do not use any products that contain nicotine or tobacco, such as cigarettes, e-cigarettes, and chewing tobacco. If you need help quitting, ask your health care provider.  Do not use street drugs.  Do not share needles.  Ask your health care provider for help if you need support or information about quitting drugs. Alcohol use  Do not drink alcohol if: ? Your health care provider tells you not to drink. ? You are pregnant, may be pregnant, or are planning to become pregnant.  If you drink alcohol: ? Limit how much you use to 0-1 drink a day. ? Limit intake if you are breastfeeding.  Be aware of how much alcohol is in your drink. In the [place] one drink equals one 12 oz bottle of beer (355 mL), one 5 oz glass of wine (148 mL), or one 1 oz glass of hard liquor (44 mL). General instructions  Schedule regular health, dental, and eye exams.  Stay current with your vaccines.  Tell your health care provider if: ? You often feel depressed. ? You have ever been abused or do not feel safe at home. Summary  Adopting a healthy lifestyle and getting preventive care are important in promoting health and wellness.  Follow your health care provider's instructions about healthy  diet, exercising, and getting tested or screened for diseases.  Follow your health care provider's instructions on monitoring your cholesterol and blood pressure. This information is not intended to replace advice given to you by your health care provider. Make sure you discuss any questions you have with your health care provider. Document Revised: 05/10/2018 Document Reviewed: 05/10/2018 Elsevier Patient Education  2020 [place] with Quitting Smoking Information for medication assistance is written as below for you to review.  Quitting smoking is a physical and mental challenge. You will face cravings, withdrawal symptoms, and temptation. Before quitting, work with your health care provider to make a plan that can help you cope. Preparation can help you quit and keep you from giving in. How can I cope with cravings? Cravings usually last for 5-10 minutes. If you get through it, the craving will pass. Consider taking the following actions to help you cope with cravings:  Keep your mouth busy: ? Chew sugar-free gum. ? Suck on hard candies or  a straw. ? Brush your teeth.  Keep your hands and body busy: ? Immediately change to a different activity when you feel a craving. ? Squeeze or play with a ball. ? Do an activity or a hobby, like making bead jewelry, practicing needlepoint, or working with wood. ? Mix up your normal routine. ? Take a short exercise break. Go for a quick walk or run up and down stairs. ? Spend time in public places where smoking is not allowed.  Focus on doing something kind or helpful for someone else.  Call a friend or family member to talk during a craving.  Join a support group.  Call a quit line, such as 1-800-QUIT-NOW.  Talk with your health care provider about medicines that might help you cope with cravings and make quitting easier for you. How can I deal with withdrawal symptoms? Your body may experience negative effects as it tries to  get used to not having nicotine in the system. These effects are called withdrawal symptoms. They may include:  Feeling hungrier than normal.  Trouble concentrating.  Irritability.  Trouble sleeping.  Feeling depressed.  Restlessness and agitation.  Craving a cigarette. To manage withdrawal symptoms:  Avoid places, people, and activities that trigger your cravings.  Remember why you want to quit.  Get plenty of sleep.  Avoid coffee and other caffeinated drinks. These may worsen some of your symptoms. How can I handle social situations? Social situations can be difficult when you are quitting smoking, especially in the first few weeks. To manage this, you can:  Avoid parties, bars, and other social situations where people might be smoking.  Avoid alcohol.  Leave right away if you have the urge to smoke.  Explain to your family and friends that you are quitting smoking. Ask for understanding and support.  Plan activities with friends or family where smoking is not an option. What are some ways I can cope with stress? Wanting to smoke may cause stress, and stress can make you want to smoke. Find ways to manage your stress. Relaxation techniques can help. For example:  Breathe slowly and deeply, in through your nose and out through your mouth.  Listen to soothing, relaxing music.  Talk with a family member or friend about your stress.  Light a candle.  Soak in a bath or take a shower.  Think about a peaceful place. What are some ways I can prevent weight gain? Be aware that many people gain weight after they quit smoking. However, not everyone does. To keep from gaining weight, have a plan in place before you quit and stick to the plan after you quit. Your plan should include:  Having healthy snacks. When you have a craving, it may help to: ? Eat plain popcorn, crunchy carrots, celery, or other cut vegetables. ? Chew sugar-free gum.  Changing how you eat: ? Eat  small portion sizes at meals. ? Eat 4-6 small meals throughout the day instead of 1-2 large meals a day. ? Be mindful when you eat. Do not watch television or do other things that might distract you as you eat.  Exercising regularly: ? Make time to exercise each day. If you do not have time for a long workout, do short bouts of exercise for 5-10 minutes several times a day. ? Do some form of strengthening exercise, like weight lifting, and some form of aerobic exercise, like running or swimming.  Drinking plenty of water or other low-calorie or no-calorie drinks. Drink  6-8 glasses of water daily, or as much as instructed by your health care provider. Summary  Quitting smoking is a physical and mental challenge. You will face cravings, withdrawal symptoms, and temptation to smoke again. Preparation can help you as you go through these challenges.  You can cope with cravings by keeping your mouth busy (such as by chewing gum), keeping your body and hands busy, and making calls to family, friends, or a helpline for people who want to quit smoking.  You can cope with withdrawal symptoms by avoiding places where people smoke, avoiding drinks with caffeine, and getting plenty of rest.  Ask your health care provider about the different ways to prevent weight gain, avoid stress, and handle social situations. This information is not intended to replace advice given to you by your health care provider. Make sure you discuss any questions you have with your health care provider. Document Revised: 04/29/2017 Document Reviewed: 05/14/2016 Elsevier Patient Education  [place]. Bupropion tablets (Depression/Mood Disorders) What is this medicine? BUPROPION (byoo PROE pee on) is used to treat depression. This medicine may be used for other purposes; ask your health care provider or pharmacist if you have questions. COMMON BRAND NAME(S): Wellbutrin What should I tell my health care provider before  I take this medicine? They need to know if you have any of these conditions:  an eating disorder, such as anorexia or bulimia  bipolar disorder or psychosis  diabetes or high blood sugar, treated with medication  glaucoma  heart disease, previous heart attack, or irregular heart beat  head injury or brain tumor  high blood pressure  kidney or liver disease  seizures  suicidal thoughts or a previous suicide attempt  Tourette's syndrome  weight loss  an unusual or allergic reaction to bupropion, other medicines, foods, dyes, or preservatives  breast-feeding  pregnant or trying to become pregnant How should I use this medicine? Take this medicine by mouth with a glass of water. Follow the directions on the prescription label. You can take it with or without food. If it upsets your stomach, take it with food. Take your medicine at regular intervals. Do not take your medicine more often than directed. Do not stop taking this medicine suddenly except upon the advice of your doctor. Stopping this medicine too quickly may cause serious side effects or your condition may worsen. A special MedGuide will be given to you by the pharmacist with each prescription and refill. Be sure to read this information carefully each time. Talk to your pediatrician regarding the use of this medicine in children. Special care may be needed. Overdosage: If you think you have taken too much of this medicine contact a poison control center or emergency room at once. NOTE: This medicine is only for you. Do not share this medicine with others. What if I miss a dose? If you miss a dose, take it as soon as you can. If it is less than four hours to your next dose, take only that dose and skip the missed dose. Do not take double or extra doses. What may interact with this medicine? Do not take this medicine with any of the following medications:  linezolid  MAOIs like Azilect, Carbex, Eldepryl, Marplan,  Nardil, and Parnate  methylene blue (injected into a vein)  other medicines that contain bupropion like Zyban This medicine may also interact with the following medications:  alcohol  certain medicines for anxiety or sleep  certain medicines for blood pressure like  metoprolol, propranolol  certain medicines for depression or psychotic disturbances  certain medicines for HIV or AIDS like efavirenz, lopinavir, nelfinavir, ritonavir  certain medicines for irregular heart beat like propafenone, flecainide  certain medicines for Parkinson's disease like amantadine, levodopa  certain medicines for seizures like carbamazepine, phenytoin, phenobarbital  cimetidine  clopidogrel  cyclophosphamide  digoxin  furazolidone  isoniazid  nicotine  orphenadrine  procarbazine  steroid medicines like prednisone or cortisone  stimulant medicines for attention disorders, weight loss, or to stay awake  tamoxifen  theophylline  thiotepa  ticlopidine  tramadol  warfarin This list may not describe all possible interactions. Give your health care provider a list of all the medicines, herbs, non-prescription drugs, or dietary supplements you use. Also tell them if you smoke, drink alcohol, or use illegal drugs. Some items may interact with your medicine. What should I watch for while using this medicine? Tell your doctor if your symptoms do not get better or if they get worse. Visit your doctor or healthcare provider for regular checks on your progress. Because it may take several weeks to see the full effects of this medicine, it is important to continue your treatment as prescribed by your doctor. This medicine may cause serious skin reactions. They can happen weeks to months after starting the medicine. Contact your healthcare provider right away if you notice fevers or flu-like symptoms with a rash. The rash may be red or purple and then turn into blisters or peeling of the skin.  Or, you might notice a red rash with swelling of the face, lips or lymph nodes in your neck or under your arms. Patients and their families should watch out for new or worsening thoughts of suicide or depression. Also watch out for sudden changes in feelings such as feeling anxious, agitated, panicky, irritable, hostile, aggressive, impulsive, severely restless, overly excited and hyperactive, or not being able to sleep. If this happens, especially at the beginning of treatment or after a change in dose, call your healthcare provider. Avoid alcoholic drinks while taking this medicine. Drinking excessive alcoholic beverages, using sleeping or anxiety medicines, or quickly stopping the use of these agents while taking this medicine may increase your risk for a seizure. Do not drive or use heavy machinery until you know how this medicine affects you. This medicine can impair your ability to perform these tasks. Do not take this medicine close to bedtime. It may prevent you from sleeping. Your mouth may get dry. Chewing sugarless gum or sucking hard candy, and drinking plenty of water may help. Contact your doctor if the problem does not go away or is severe. What side effects may I notice from receiving this medicine? Side effects that you should report to your doctor or health care professional as soon as possible:  allergic reactions like skin rash, itching or hives, swelling of the face, lips, or tongue  breathing problems  changes in vision  confusion  elevated mood, decreased need for sleep, racing thoughts, impulsive behavior  fast or irregular heartbeat  hallucinations, loss of contact with reality  increased blood pressure  rash, fever, and swollen lymph nodes  redness, blistering, peeling, or loosening of the skin, including inside the mouth  seizures  suicidal thoughts or other mood changes  unusually weak or tired  vomiting Side effects that usually do not require medical  attention (report to your doctor or health care professional if they continue or are bothersome):  constipation  headache  loss of appetite  nausea  tremors  weight loss This list may not describe all possible side effects. Call your doctor for medical advice about side effects. You may report side effects to FDA at 1-800-FDA-1088. Where should I keep my medicine? Keep out of the reach of children. Store at room temperature between 20 and 25 degrees C (68 and 77 degrees F), away from direct sunlight and moisture. Keep tightly closed. Throw away any unused medicine after the expiration date. NOTE: This sheet is a summary. It may not cover all possible information. If you have questions about this medicine, talk to your doctor, pharmacist, or health care provider.  2020 Elsevier/Gold Standard (2018-08-10 14:02:47) Nicotine skin patches What is this medicine? NICOTINE ([place]) helps people stop smoking. The patches replace the nicotine found in cigarettes and help to decrease withdrawal effects. They are most effective when used in combination with a stop-smoking program. This medicine may be used for other purposes; ask your health care provider or pharmacist if you have questions. COMMON BRAND NAME(S): Habitrol, Nicoderm CQ, Nicotrol What should I tell my health care provider before I take this medicine? They need to know if you have any of these conditions:  diabetes  heart disease, angina, irregular heartbeat or previous heart attack  high blood pressure  lung disease, including asthma  overactive thyroid  pheochromocytoma  seizures or a history of seizures  skin problems, like eczema  stomach problems or ulcers  an unusual or allergic reaction to nicotine, adhesives, other medicines, foods, dyes, or preservatives  pregnant or trying to get pregnant  breast-feeding How should I use this medicine? This medicine is for use on the skin. Follow the directions  that come with the patches. Find an area of skin on your upper arm, chest, or back that is clean, dry, greaseless, undamaged and hairless. Wash hands with plain soap and water. Do not use anything that contains aloe, lanolin or glycerin as these may prevent the patch from sticking. Dry thoroughly. Remove the patch from the sealed pouch. Do not try to cut or trim the patch. Using your palm, press the patch firmly in place for 10 seconds to make sure that there is good contact with your skin. After applying the patch, wash your hands. Change the patch every day, keeping to a regular schedule. When you apply a new patch, use a new area of skin. Wait at least 1 week before using the same area again. Talk to your pediatrician regarding the use of this medicine in children. Special care may be needed. Overdosage: If you think you have taken too much of this medicine contact a poison control center or emergency room at once. NOTE: This medicine is only for you. Do not share this medicine with others. What if I miss a dose? If you forget to replace a patch, use it as soon as you can. Only use one patch at a time and do not leave on the skin for longer than directed. If a patch falls off, you can replace it, but keep to your schedule and remove the patch at the right time. What may interact with this medicine?  medicines for asthma  medicines for blood pressure  medicines for mental depression This list may not describe all possible interactions. Give your health care provider a list of all the medicines, herbs, non-prescription drugs, or dietary supplements you use. Also tell them if you smoke, drink alcohol, or use illegal drugs. Some items may interact with your medicine.  What should I watch for while using this medicine? You should begin using the nicotine patch the day you stop smoking. It is okay if you do not succeed at your attempt to quit and have a cigarette. You can still continue your quit attempt  and keep using the product as directed. Just throw away your cigarettes and get back to your quit plan. You can keep the patch in place during swimming, bathing, and showering. If your patch falls off during these activities, replace it. When you first apply the patch, your skin may itch or burn. This should go away soon. When you remove a patch, the skin may look red, but this should only last for a few days. Call your doctor or health care professional if skin redness does not go away after 4 days, if your skin swells, or if you get a rash. If you are a diabetic and you quit smoking, the effects of insulin may be increased and you may need to reduce your insulin dose. Check with your doctor or health care professional about how you should adjust your insulin dose. If you are going to have a magnetic resonance imaging (MRI) procedure, tell your MRI technician if you have this patch on your body. It must be removed before a MRI. What side effects may I notice from receiving this medicine? Side effects that you should report to your doctor or health care professional as soon as possible:  allergic reactions like skin rash, itching or hives, swelling of the face, lips, or tongue  breathing problems  changes in hearing  changes in vision  chest pain  cold sweats  confusion  fast, irregular heartbeat  feeling faint or lightheaded, falls  headache  increased saliva  skin redness that lasts more than 4 days  stomach pain  signs and symptoms of nicotine overdose like nausea; vomiting; dizziness; weakness; and rapid heartbeat Side effects that usually do not require medical attention (report to your doctor or health care professional if they continue or are bothersome):  diarrhea  dry mouth  hiccups  irritability  nervousness or restlessness  trouble sleeping or vivid dreams This list may not describe all possible side effects. Call your doctor for medical advice about side  effects. You may report side effects to FDA at 1-800-FDA-1088. Where should I keep my medicine? Keep out of the reach of children. Store at room temperature between 20 and 25 degrees C (68 and 77 degrees F). Protect from heat and light. Store in International aid/development worker until ready to use. Throw away unused medicine after the expiration date. When you remove a patch, fold with sticky sides together; put in an empty opened pouch and throw away. NOTE: This sheet is a summary. It may not cover all possible information. If you have questions about this medicine, talk to your doctor, pharmacist, or health care provider.  2020 Elsevier/Gold Standard (2014-04-15 15:46:21) Varenicline oral tablets What is this medicine? VARENICLINE (var e NI kleen) is used to help people quit smoking. It is used with a patient support program recommended by your physician. This medicine may be used for other purposes; ask your health care provider or pharmacist if you have questions. COMMON BRAND NAME(S): Chantix What should I tell my health care provider before I take this medicine? They need to know if you have any of these conditions:  heart disease  if you often drink alcohol  kidney disease  mental illness  on hemodialysis  seizures  history of  stroke  suicidal thoughts, plans, or attempt; a previous suicide attempt by you or a family member  an unusual or allergic reaction to varenicline, other medicines, foods, dyes, or preservatives  pregnant or trying to get pregnant  breast-feeding How should I use this medicine? Take this medicine by mouth after eating. Take with a full glass of water. Follow the directions on the prescription label. Take your doses at regular intervals. Do not take your medicine more often than directed. There are 3 ways you can use this medicine to help you quit smoking; talk to your health care professional to decide which plan is right for you: 1) you can choose a quit  date and start this medicine 1 week before the quit date, or, 2) you can start taking this medicine before you choose a quit date, and then pick a quit date between day 8 and 35 days of treatment, or, 3) if you are not sure that you are able or willing to quit smoking right away, start taking this medicine and slowly decrease the amount you smoke as directed by your health care professional with the goal of being cigarette-free by week 12 of treatment. Stick to your plan; ask about support groups or other ways to help you remain cigarette-free. If you are motivated to quit smoking and did not succeed during a previous attempt with this medicine for reasons other than side effects, or if you returned to smoking after this treatment, speak with your health care professional about whether another course of this medicine may be right for you. A special MedGuide will be given to you by the pharmacist with each prescription and refill. Be sure to read this information carefully each time. Talk to your pediatrician regarding the use of this medicine in children. This medicine is not approved for use in children. Overdosage: If you think you have taken too much of this medicine contact a poison control center or emergency room at once. NOTE: This medicine is only for you. Do not share this medicine with others. What if I miss a dose? If you miss a dose, take it as soon as you can. If it is almost time for your next dose, take only that dose. Do not take double or extra doses. What may interact with this medicine?  alcohol  insulin  other medicines used to help people quit smoking  theophylline  warfarin This list may not describe all possible interactions. Give your health care provider a list of all the medicines, herbs, non-prescription drugs, or dietary supplements you use. Also tell them if you smoke, drink alcohol, or use illegal drugs. Some items may interact with your medicine. What should I  watch for while using this medicine? It is okay if you do not succeed at your attempt to quit and have a cigarette. You can still continue your quit attempt and keep using this medicine as directed. Just throw away your cigarettes and get back to your quit plan. Talk to your health care provider before using other treatments to quit smoking. Using this medicine with other treatments to quit smoking may increase the risk for side effects compared to using a treatment alone. You may get drowsy or dizzy. Do not drive, use machinery, or do anything that needs mental alertness until you know how this medicine affects you. Do not stand or sit up quickly, especially if you are an older patient. This reduces the risk of dizzy or fainting spells. Decrease the number of  alcoholic beverages that you drink during treatment with this medicine until you know if this medicine affects your ability to tolerate alcohol. Some people have experienced increased drunkenness (intoxication), unusual or sometimes aggressive behavior, or no memory of things that have happened (amnesia) during treatment with this medicine. Sleepwalking can happen during treatment with this medicine, and can sometimes lead to behavior that is harmful to you, other people, or property. Stop taking this medicine and tell your doctor if you start sleepwalking or have other unusual sleep-related activity. After taking this medicine, you may get up out of bed and do an activity that you do not know you are doing. The next morning, you may have no memory of this. Activities include driving a car ("sleep-driving"), making and eating food, talking on the phone, sexual activity, and sleep-walking. Serious injuries have occurred. Stop the medicine and call your doctor right away if you find out you have done any of these activities. Do not take this medicine if you have used alcohol that evening. Do not take it if you have taken another medicine for sleep. The  risk of doing these sleep-related activities is higher. Patients and their families should watch out for new or worsening depression or thoughts of suicide. Also watch out for sudden changes in feelings such as feeling anxious, agitated, panicky, irritable, hostile, aggressive, impulsive, severely restless, overly excited and hyperactive, or not being able to sleep. If this happens, call your health care professional. If you have diabetes and you quit smoking, the effects of insulin may be increased and you may need to reduce your insulin dose. Check with your doctor or health care professional about how you should adjust your insulin dose. What side effects may I notice from receiving this medicine? Side effects that you should report to your doctor or health care professional as soon as possible:  allergic reactions like skin rash, itching or hives, swelling of the face, lips, tongue, or throat  acting aggressive, being angry or violent, or acting on dangerous impulses  breathing problems  changes in emotions or moods  chest pain or chest tightness  feeling faint or lightheaded, falls  hallucination, loss of contact with reality  mouth sores  redness, blistering, peeling or loosening of the skin, including inside the mouth  signs and symptoms of a stroke like changes in vision; confusion; trouble speaking or understanding; severe headaches; sudden numbness or weakness of the face, arm or leg; trouble walking; dizziness; loss of balance or coordination  seizures  sleepwalking  suicidal thoughts or other mood changes Side effects that usually do not require medical attention (report to your doctor or health care professional if they continue or are bothersome):  constipation  gas  headache  nausea, vomiting  strange dreams  trouble sleeping This list may not describe all possible side effects. Call your doctor for medical advice about side effects. You may report side  effects to FDA at 1-800-FDA-1088. Where should I keep my medicine? Keep out of the reach of children. Store at room temperature between 15 and 30 degrees C (59 and 86 degrees F). Throw away any unused medicine after the expiration date. NOTE: This sheet is a summary. It may not cover all possible information. If you have questions about this medicine, talk to your doctor, pharmacist, or health care provider.  2020 Elsevier/Gold Standard (2018-05-05 14:27:36)   Fat and Cholesterol Restricted Eating Plan Getting too much fat and cholesterol in your diet may cause health problems. Choosing the  right foods helps keep your fat and cholesterol at normal levels. This can keep you from getting certain diseases. Your doctor may recommend an eating plan that includes:  Total fat: ______% or less of total calories a day.  Saturated fat: ______% or less of total calories a day.  Cholesterol: less than _________mg a day.  Fiber: ______g a day. What are tips for following this plan? Meal planning  At meals, divide your plate into four equal parts: ? Fill one-half of your plate with vegetables and green salads. ? Fill one-fourth of your plate with whole grains. ? Fill one-fourth of your plate with low-fat (lean) protein foods.  Eat fish that is high in omega-3 fats at least two times a week. This includes mackerel, tuna, sardines, and salmon.  Eat foods that are high in fiber, such as whole grains, beans, apples, broccoli, carrots, peas, and barley. General tips   Work with your doctor to lose weight if you need to.  Avoid: ? Foods with added sugar. ? Fried foods. ? Foods with partially hydrogenated oils.  Limit alcohol intake to no more than 1 drink a day for nonpregnant women and 2 drinks a day for men. One drink equals 12 oz of beer, 5 oz of wine, or 1 oz of hard liquor. Reading food labels  Check food labels for: ? Trans fats. ? Partially hydrogenated oils. ? Saturated fat (g) in  each serving. ? Cholesterol (mg) in each serving. ? Fiber (g) in each serving.  Choose foods with healthy fats, such as: ? Monounsaturated fats. ? Polyunsaturated fats. ? Omega-3 fats.  Choose grain products that have whole grains. Look for the word "whole" as the first word in the ingredient list. Cooking  Cook foods using low-fat methods. These include baking, boiling, grilling, and broiling.  Eat more home-cooked foods. Eat at restaurants and buffets less often.  Avoid cooking using saturated fats, such as butter, cream, palm oil, palm kernel oil, and coconut oil. Recommended foods  Fruits  All fresh, canned (in natural juice), or frozen fruits. Vegetables  Fresh or frozen vegetables (raw, steamed, roasted, or grilled). Green salads. Grains  Whole grains, such as whole wheat or whole grain breads, crackers, cereals, and pasta. Unsweetened oatmeal, bulgur, barley, quinoa, or brown rice. Corn or whole wheat flour tortillas. Meats and other protein foods  Ground beef (85% or leaner), grass-fed beef, or beef trimmed of fat. Skinless chicken or [place]. Ground chicken or [place]. Pork trimmed of fat. All fish and seafood. Egg whites. Dried beans, peas, or lentils. Unsalted nuts or seeds. Unsalted canned beans. Nut butters without added sugar or oil. Dairy  Low-fat or nonfat dairy products, such as skim or 1% milk, 2% or reduced-fat cheeses, low-fat and fat-free ricotta or cottage cheese, or plain low-fat and nonfat yogurt. Fats and oils  Tub margarine without trans fats. Light or reduced-fat mayonnaise and salad dressings. Avocado. Olive, canola, sesame, or safflower oils. The items listed above may not be a complete list of foods and beverages you can eat. Contact a dietitian for more information. Foods to avoid Fruits  Canned fruit in heavy syrup. Fruit in cream or butter sauce. Fried fruit. Vegetables  Vegetables cooked in cheese, cream, or butter sauce. Fried  vegetables. Grains  White bread. White pasta. White rice. Cornbread. Bagels, pastries, and croissants. Crackers and snack foods that contain trans fat and hydrogenated oils. Meats and other protein foods  Fatty cuts of meat. Ribs, chicken wings, bacon, sausage, bologna, salami,  chitterlings, fatback, hot dogs, bratwurst, and packaged lunch meats. Liver and organ meats. Whole eggs and egg yolks. Chicken and [place] with skin. Fried meat. Dairy  Whole or 2% milk, cream, half-and-half, and cream cheese. Whole milk cheeses. Whole-fat or sweetened yogurt. Full-fat cheeses. Nondairy creamers and whipped toppings. Processed cheese, cheese spreads, and cheese curds. Beverages  Alcohol. Sugar-sweetened drinks such as sodas, lemonade, and fruit drinks. Fats and oils  Butter, stick margarine, lard, shortening, ghee, or bacon fat. Coconut, palm kernel, and palm oils. Sweets and desserts  Corn syrup, sugars, honey, and molasses. Candy. Jam and jelly. Syrup. Sweetened cereals. Cookies, pies, cakes, donuts, muffins, and ice cream. The items listed above may not be a complete list of foods and beverages you should avoid. Contact a dietitian for more information. Summary  Choosing the right foods helps keep your fat and cholesterol at normal levels. This can keep you from getting certain diseases.  At meals, fill one-half of your plate with vegetables and green salads.  Eat high-fiber foods, like whole grains, beans, apples, carrots, peas, and barley.  Limit added sugar, saturated fats, alcohol, and fried foods. This information is not intended to replace advice given to you by your health care provider. Make sure you discuss any questions you have with your health care provider. Document Revised: 01/18/2018 Document Reviewed: 02/01/2017 Elsevier Patient Education  [place].

## 2019-09-14 LAB — COMPREHENSIVE METABOLIC PANEL
ALT: 14 IU/L (ref 0–32)
AST: 15 IU/L (ref 0–40)
Albumin/Globulin Ratio: 1.3 (ref 1.2–2.2)
Albumin: 4.1 g/dL (ref 3.8–4.8)
Alkaline Phosphatase: 81 IU/L (ref 39–117)
BUN/Creatinine Ratio: 20 (ref 9–23)
BUN: 12 mg/dL (ref 6–24)
Bilirubin Total: 0.3 mg/dL (ref 0.0–1.2)
CO2: 23 mmol/L (ref 20–29)
Calcium: 9.2 mg/dL (ref 8.7–10.2)
Chloride: 104 mmol/L (ref 96–106)
Creatinine, Ser: 0.61 mg/dL (ref 0.57–1.00)
GFR calc Af Amer: 127 mL/min/{1.73_m2} (ref >59)
GFR calc non Af Amer: 110 mL/min/{1.73_m2} (ref >59)
Globulin, Total: 3.1 g/dL (ref 1.5–4.5)
Glucose: 98 mg/dL (ref 65–99)
Potassium: 4.3 mmol/L (ref 3.5–5.2)
Sodium: 140 mmol/L (ref 134–144)
Total Protein: 7.2 g/dL (ref 6.0–8.5)

## 2019-09-14 LAB — IRON,TIBC AND FERRITIN PANEL
Ferritin: 8 ng/mL — ABNORMAL LOW (ref 15–150)
Iron Saturation: 7 % — CL (ref 15–55)
Iron: 23 ug/dL — ABNORMAL LOW (ref 27–159)
Total Iron Binding Capacity: 351 ug/dL (ref 250–450)
UIBC: 328 ug/dL (ref 131–425)

## 2019-09-14 LAB — LIPID PANEL
Chol/HDL Ratio: 4.4 ratio (ref 0.0–4.4)
Cholesterol, Total: 154 mg/dL (ref 100–199)
HDL: 35 mg/dL — ABNORMAL LOW (ref >39)
LDL Chol Calc (NIH): 105 mg/dL — ABNORMAL HIGH (ref 0–99)
Triglycerides: 74 mg/dL (ref 0–149)
VLDL Cholesterol Cal: 14 mg/dL (ref 5–40)

## 2019-09-14 LAB — TSH: TSH: 0.593 u[IU]/mL (ref 0.450–4.500)

## 2019-09-14 LAB — HEMOGLOBIN A1C
Est. average glucose Bld gHb Est-mCnc: 94 mg/dL
Hgb A1c MFr Bld: 4.9 % (ref 4.8–5.6)

## 2019-09-14 LAB — HIV ANTIBODY (ROUTINE TESTING W REFLEX): HIV Screen 4th Generation wRfx: NONREACTIVE

## 2019-09-14 LAB — RPR: RPR Ser Ql: NONREACTIVE

## 2019-09-16 LAB — CBC WITH DIFFERENTIAL/PLATELET
Basophils Absolute: 0.2 10*3/uL (ref 0.0–0.2)
Basos: 3 %
EOS (ABSOLUTE): 0.1 10*3/uL (ref 0.0–0.4)
Eos: 1 %
Hematocrit: 34.2 % (ref 34.0–46.6)
Hemoglobin: 10.8 g/dL — ABNORMAL LOW (ref 11.1–15.9)
Immature Grans (Abs): 0 10*3/uL (ref 0.0–0.1)
Immature Granulocytes: 0 %
Lymphocytes Absolute: 2.1 10*3/uL (ref 0.7–3.1)
Lymphs: 29 %
MCH: 23.9 pg — ABNORMAL LOW (ref 26.6–33.0)
MCHC: 31.6 g/dL (ref 31.5–35.7)
MCV: 76 fL — ABNORMAL LOW (ref 79–97)
Monocytes Absolute: 0.4 10*3/uL (ref 0.1–0.9)
Monocytes: 6 %
Neutrophils Absolute: 4.5 10*3/uL (ref 1.4–7.0)
Neutrophils: 61 %
Platelets: 237 10*3/uL (ref 150–450)
RBC: 4.52 x10E6/uL (ref 3.77–5.28)
RDW: 17.5 % — ABNORMAL HIGH (ref 11.7–15.4)
WBC: 7.4 10*3/uL (ref 3.4–10.8)

## 2019-09-16 LAB — SPECIMEN STATUS REPORT

## 2019-09-17 NOTE — Progress Notes (Signed)
CBC shows anemia,and iron saturation is low -  any excessive vaginal bleeding,? or rectal bleeding- dark colored stools ?  She will need iron if she is in agreement- verify if she is taking any  ? Ever had colonoscopy ?  Will send iron in on 09/18/19 when back in office.  TSH for thyroid is within normal limits.  HIV non reactive/ negative. RPR for syphilis is negative.   Hemoglobin A1C is negative for diabetes or prediabetes.   Add on repeat CBC and TIBC iron panel for lab recheck in 3 months

## 2019-09-18 ENCOUNTER — Telehealth: Payer: Self-pay

## 2019-09-18 NOTE — Telephone Encounter (Signed)
Advised patient of lab results she denies excessive vaginal bleeding, rectal bleeding or dark colored stools. Patient states that she has a history of anemia, she is currently taking vitamin D and otc Iron at 65mg .

## 2019-09-18 NOTE — Telephone Encounter (Signed)
-----   Message from Doreen Beam, [place] sent at 09/17/2019 12:10 PM EDT ----- CBC shows anemia,and iron saturation is low -  any excessive vaginal bleeding,? or rectal bleeding- dark colored stools ?  She will need iron if she is in agreement- verify if she is taking any  ? Ever had colonoscopy ?  Will send iron in on 09/18/19 when back in office.  TSH for thyroid is within normal limits.  HIV non reactive/ negative. RPR for syphilis is negative.   Hemoglobin A1C is negative for diabetes or prediabetes.   Add on repeat CBC and TIBC iron panel for lab recheck in 3 months

## 2019-10-09 ENCOUNTER — Encounter: Payer: Self-pay | Admitting: Obstetrics and Gynecology

## 2019-10-15 ENCOUNTER — Ambulatory Visit: Payer: Medicaid Other | Admitting: Dermatology

## 2019-10-15 ENCOUNTER — Telehealth: Payer: Self-pay

## 2019-10-15 ENCOUNTER — Other Ambulatory Visit: Payer: Self-pay

## 2019-10-15 DIAGNOSIS — D692 Other nonthrombocytopenic purpura: Secondary | ICD-10-CM

## 2019-10-15 DIAGNOSIS — L309 Dermatitis, unspecified: Secondary | ICD-10-CM | POA: Diagnosis not present

## 2019-10-15 DIAGNOSIS — L810 Postinflammatory hyperpigmentation: Secondary | ICD-10-CM | POA: Diagnosis not present

## 2019-10-15 MED ORDER — TACROLIMUS 0.1 % EX OINT: TOPICAL_OINTMENT | CUTANEOUS | 0 refills | Status: DC

## 2019-10-15 MED ORDER — EUCRISA 2 % EX OINT: TOPICAL_OINTMENT | CUTANEOUS | 0 refills | Status: DC

## 2019-10-15 NOTE — Telephone Encounter (Signed)
Patient informedTacrolimus sent to patient's pharmacy per Dr. Les Pou instruction BID to eyelids PRN.

## 2019-10-15 NOTE — Telephone Encounter (Signed)
Send in Tacrolimus 0.1% ointment bid to eyelids, 30 gm 2 rfs

## 2019-10-15 NOTE — Telephone Encounter (Signed)
Eucrisa 2% ointment is not covered by patient's insurance. Please advise which alternative you would like for me to send in : Clobetasol gel, Halobetasol ointment, TMC 0.1% cream, Pimecrolimus, Fluocinonide 0.1% cream, Tacrolimus 0.1% ointment, Betamethasone Dipropionate cream

## 2019-10-15 NOTE — Progress Notes (Signed)
   New Patient Visit  Subjective  Wendy Hogan is a 44 y.o. female who presents for the following: Rash (periocular x 1 month). Patient states the area around her eyes is dry and puffy, but no other symptoms. She hasn't treated area with any creams, she only uses a night cream around her eyes at night Ronnette Juniper brand that she started 2 wks ago, after the rash had appeared). She uses [employer]. Patient has a history of eczema as a child. Has dry sensitive skin, but no other rashes. No history of asthma or allergies. She has gel polish on nails, but has been using this for years with no problems. Patient has a history of Lupus treated by Dr Meda Coffee. She takes Plaquenil 200mg  BID, which she's been on for years with no change.   The following portions of the chart were reviewed this encounter and updated as appropriate:      Review of Systems:  No other skin or systemic complaints except as noted in HPI or Assessment and Plan.  Objective  Well appearing patient in no apparent distress; mood and affect are within normal limits.  A focused examination was performed including face. Relevant physical exam findings are noted in the Assessment and Plan.  Objective  Periocular: Periocular hyperpigmentation, mild edema and lichenification.   Assessment & Plan   Purpura - Violaceous macules and patches on left lower leg - Benign - Related to age, sun damage and/or use of blood thinners - Observe - Can use OTC arnica containing moisturizer such as Dermend Bruise Formula if desired - Call for worsening or other concerns   Eczema, unspecified type Periocular  With PIH, possible atopic Start Eucrisa Ointment Apply to rash around eyes BID until improved. Risk stinging, burning with first few applications.  Start VaniCream Ointment and VaniCream - samples given. Continue Dove soap. Avoid using night cream and eye makeup until rash improved.  Patient advised darkness around eyes will take  time to fade. Wear sunglasses with UV protection when outdoors.  Will send in tacrolimus 0.1% ointment if Eucrisa not approved  Crisaborole (EUCRISA) 2 % OINT - Periocular  Return in about 6 weeks (around 11/26/2019) for Eczema.  IJamesetta Orleans, CMA, am acting as scribe for Brendolyn Patty, MD .  Documentation: I have reviewed the above documentation for accuracy and completeness, and I agree with the above.  Brendolyn Patty MD

## 2019-10-18 ENCOUNTER — Other Ambulatory Visit: Payer: Self-pay

## 2019-10-18 MED ORDER — PROTOPIC 0.1 % EX OINT: TOPICAL_OINTMENT | Freq: Two times a day (BID) | CUTANEOUS | 0 refills | Status: DC

## 2019-10-18 NOTE — Progress Notes (Signed)
Protopic sent in as DAW due Medicaid coverage and formulary.

## 2019-11-02 ENCOUNTER — Encounter: Payer: Self-pay | Admitting: Obstetrics and Gynecology

## 2019-11-23 ENCOUNTER — Encounter: Payer: Self-pay | Admitting: Obstetrics and Gynecology

## 2019-12-05 ENCOUNTER — Ambulatory Visit: Payer: Medicaid Other | Admitting: Dermatology

## 2019-12-17 ENCOUNTER — Ambulatory Visit (INDEPENDENT_AMBULATORY_CARE_PROVIDER_SITE_OTHER): Payer: Medicaid Other | Admitting: Obstetrics and Gynecology

## 2019-12-17 ENCOUNTER — Other Ambulatory Visit: Payer: Self-pay

## 2019-12-17 ENCOUNTER — Other Ambulatory Visit (HOSPITAL_COMMUNITY)
Admission: RE | Admit: 2019-12-17 | Discharge: 2019-12-17 | Disposition: A | Discharge status: Home / Self Care | Payer: Medicaid Other | Source: Ambulatory Visit | Attending: Obstetrics and Gynecology | Admitting: Obstetrics and Gynecology

## 2019-12-17 ENCOUNTER — Encounter: Payer: Self-pay | Admitting: Obstetrics and Gynecology

## 2019-12-17 VITALS — BP 130/80 | HR 64 | Resp 18 | Ht 65.0 in | Wt 154.2 lb

## 2019-12-17 DIAGNOSIS — Z113 Encounter for screening for infections with a predominantly sexual mode of transmission: Secondary | ICD-10-CM | POA: Diagnosis not present

## 2019-12-17 DIAGNOSIS — Z124 Encounter for screening for malignant neoplasm of cervix: Secondary | ICD-10-CM

## 2019-12-17 DIAGNOSIS — Z1239 Encounter for other screening for malignant neoplasm of breast: Secondary | ICD-10-CM

## 2019-12-17 DIAGNOSIS — Z1211 Encounter for screening for malignant neoplasm of colon: Secondary | ICD-10-CM

## 2019-12-17 DIAGNOSIS — Z1231 Encounter for screening mammogram for malignant neoplasm of breast: Secondary | ICD-10-CM | POA: Diagnosis not present

## 2019-12-17 DIAGNOSIS — Z01419 Encounter for gynecological examination (general) (routine) without abnormal findings: Secondary | ICD-10-CM | POA: Diagnosis not present

## 2019-12-17 DIAGNOSIS — Z0000 Encounter for general adult medical examination without abnormal findings: Secondary | ICD-10-CM | POA: Diagnosis not present

## 2019-12-17 NOTE — Patient Instructions (Signed)
[facility] [place] for Calcium and Vitamin D  Age (yr) Calcium Recommended Dietary Allowance (mg/day) Vitamin D Recommended Dietary Allowance (international units/day)  9-18 1,300 600  19-50 1,000 600  51-70 1,200 600  71 and older 1,200 800  Data from Institute of Medicine. Dietary reference intakes: calcium, vitamin D. [place], [place]: [employer]; 2011.     [email or web address]    Budget-Friendly Healthy Eating There are many ways to save money at the grocery store and continue to eat healthy. You can be successful if you:  Plan meals according to your budget.  Make a grocery list and only purchase food according to your grocery list.  Prepare food yourself. What are tips for following this plan?  Reading food labels  Compare food labels between brand name foods and the store brand. Often the nutritional value is the same, but the store brand is lower cost.  Look for products that do not have added sugar, fat, or salt (sodium). These often cost the same but are healthier for you. Products may be labeled as: ? Sugar-free. ? Nonfat. ? Low-fat. ? Sodium-free. ? Low-sodium.  Look for lean ground beef labeled as at least 92% lean and 8% fat. Shopping  Buy only the items on your grocery list and go only to the areas of the store that have the items on your list.  Use coupons only for foods and brands you normally buy. Avoid buying items you wouldn't normally buy simply because they are on sale.  Check online and in newspapers for weekly deals.  Buy healthy items from the bulk bins when available, such as herbs, spices, flour, pasta, nuts, and dried fruit.  Buy fruits and vegetables that are in season. Prices are usually lower on in-season produce.  Look at the unit price on the price tag. Use it to compare different brands and sizes to find out which item is the best deal.  Choose healthy items that are  often low-cost, such as carrots, potatoes, apples, bananas, and oranges. Dried or canned beans are a low-cost protein source.  Buy in bulk and freeze extra food. Items you can buy in bulk include meats, fish, poultry, frozen fruits, and frozen vegetables.  Avoid buying "ready-to-eat" foods, such as pre-cut fruits and vegetables and pre-made salads.  If possible, shop around to discover where you can find the best prices. Consider other retailers such as dollar stores, larger [employer], local fruit and vegetable stands, and farmers markets.  Do not shop when you are hungry. If you shop while hungry, it may be hard to stick to your list and budget.  Resist impulse buying. Use your grocery list as your official plan for the week.  Buy a variety of vegetables and fruits by purchasing fresh, frozen, and canned items.  Look at the top and bottom shelves for deals. Foods at eye level (eye level of an adult or child) are usually more expensive.  Be efficient with your time when shopping. The more time you spend at the store, the more money you are likely to spend.  To save money when choosing more expensive foods like meats and dairy: ? Choose cheaper cuts of meat, such as bone-in chicken thighs and drumsticks instead of skinless and boneless chicken. When you are ready to prepare the chicken, you can remove the skin yourself to make it healthier. ? Choose lean meats like chicken or [place] instead of beef. ? Choose canned seafood, such as tuna, salmon,  or sardines. ? Buy eggs as a low-cost source of protein. ? Buy dried beans and peas, such as lentils, split peas, or kidney beans instead of meats. Dried beans and peas are a good alternative source of protein. ? Buy the larger tubs of yogurt instead of individual-sized containers.  Choose water instead of sodas and other sweetened beverages.  Avoid buying chips, cookies, and other "junk food." These items are usually expensive and not  healthy. Cooking  Make extra food and freeze the extras in meal-sized containers or in individual portions for fast meals and snacks.  Pre-cook on days when you have extra time to prepare meals in advance. You can keep these meals in the fridge or freezer and reheat for a quick meal.  When you come home from the grocery store, wash, peel, and cut fruits and vegetables so they are ready to use and eat. This will help reduce food waste. Meal planning  Do not eat out or get fast food. Prepare food at home.  Make a grocery list and make sure to bring it with you to the store. If you have a smart phone, you could use your phone to create your shopping list.  Plan meals and snacks according to a grocery list and budget you create.  Use leftovers in your meal plan for the week.  Look for recipes where you can cook once and make enough food for two meals.  Include budget-friendly meals like stews, casseroles, and stir-fry dishes.  Try some meatless meals or try "no cook" meals like salads.  Make sure that half your plate is filled with fruits or vegetables. Choose from fresh, frozen, or canned fruits and vegetables. If eating canned, remember to rinse them before eating. This will remove any excess salt added for packaging. Summary  Eating healthy on a budget is possible if you plan your meals according to your budget, purchase according to your budget and grocery list, and prepare food yourself.  Tips for buying more food on a limited budget include buying generic brands, using coupons only for foods you normally buy, and buying healthy items from the bulk bins when available.  Tips for buying cheaper food to replace expensive food include choosing cheaper, lean cuts of meat, and buying dried beans and peas. This information is not intended to replace advice given to you by your health care provider. Make sure you discuss any questions you have with your health care provider. Document  Revised: 05/18/2017 Document Reviewed: 05/18/2017 Elsevier Patient Education  2020 [employer].   Exercising to Stay Healthy To become healthy and stay healthy, it is recommended that you do moderate-intensity and vigorous-intensity exercise. You can tell that you are exercising at a moderate intensity if your heart starts beating faster and you start breathing faster but can still hold a conversation. You can tell that you are exercising at a vigorous intensity if you are breathing much harder and faster and cannot hold a conversation while exercising. Exercising regularly is important. It has many health benefits, such as:  Improving overall fitness, flexibility, and endurance.  Increasing bone density.  Helping with weight control.  Decreasing body fat.  Increasing muscle strength.  Reducing stress and tension.  Improving overall health. How often should I exercise? Choose an activity that you enjoy, and set realistic goals. Your health care provider can help you make an activity plan that works for you. Exercise regularly as told by your health care provider. This may include:  Doing  strength training two times a week, such as: ? Lifting weights. ? Using resistance bands. ? Push-ups. ? Sit-ups. ? Yoga.  Doing a certain intensity of exercise for a given amount of time. Choose from these options: ? A total of 150 minutes of moderate-intensity exercise every week. ? A total of 75 minutes of vigorous-intensity exercise every week. ? A mix of moderate-intensity and vigorous-intensity exercise every week. Children, pregnant women, people who have not exercised regularly, people who are overweight, and older adults may need to talk with a health care provider about what activities are safe to do. If you have a medical condition, be sure to talk with your health care provider before you start a new exercise program. What are some exercise ideas? Moderate-intensity exercise ideas  include:  Walking 1 mile (1.6 km) in about 15 minutes.  Biking.  Hiking.  Golfing.  Dancing.  Water aerobics. Vigorous-intensity exercise ideas include:  Walking 4.5 miles (7.2 km) or more in about 1 hour.  Jogging or running 5 miles (8 km) in about 1 hour.  Biking 10 miles (16.1 km) or more in about 1 hour.  Lap swimming.  Roller-skating or in-line skating.  Cross-country skiing.  Vigorous competitive sports, such as football, basketball, and soccer.  Jumping rope.  Aerobic dancing. What are some everyday activities that can help me to get exercise?  [place]field work, such as: ? Pushing a Conservation officer, nature. ? Raking and bagging leaves.  Washing your car.  Pushing a stroller.  Shoveling snow.  Gardening.  Washing windows or floors. How can I be more active in my day-to-day activities?  Use stairs instead of an elevator.  Take a walk during your lunch break.  If you drive, park your car farther away from your work or school.  If you take public transportation, get off one stop early and walk the rest of the way.  Stand up or walk around during all of your indoor phone calls.  Get up, stretch, and walk around every 30 minutes throughout the day.  Enjoy exercise with a friend. Support to continue exercising will help you keep a regular routine of activity. What guidelines can I follow while exercising?  Before you start a new exercise program, talk with your health care provider.  Do not exercise so much that you hurt yourself, feel dizzy, or get very short of breath.  Wear comfortable clothes and wear shoes with good support.  Drink plenty of water while you exercise to prevent dehydration or heat stroke.  Work out until your breathing and your heartbeat get faster. Where to find more information  U.S. Department of Health and Human Services: BondedCompany.at  Centers for Disease Control and Prevention (CDC): http://www.wolf.info/ Summary  Exercising regularly is  important. It will improve your overall fitness, flexibility, and endurance.  Regular exercise also will improve your overall health. It can help you control your weight, reduce stress, and improve your bone density.  Do not exercise so much that you hurt yourself, feel dizzy, or get very short of breath.  Before you start a new exercise program, talk with your health care provider. This information is not intended to replace advice given to you by your health care provider. Make sure you discuss any questions you have with your health care provider. Document Revised: 04/29/2017 Document Reviewed: 04/07/2017 Elsevier Patient Education  2020 [place] protect organs, store calcium, anchor muscles, and support the whole body. Keeping your bones strong is  important, especially as you get older. You can take actions to help keep your bones strong and healthy. Why is keeping my bones healthy important?  Keeping your bones healthy is important because your body constantly replaces bone cells. Cells get old, and new cells take their place. As we age, we lose bone cells because the body may not be able to make enough new cells to replace the old cells. The amount of bone cells and bone tissue you have is referred to as bone mass. The higher your bone mass, the stronger your bones. The aging process leads to an overall loss of bone mass in the body, which can increase the likelihood of:  Joint pain and stiffness.  Broken bones.  A condition in which the bones become weak and brittle (osteoporosis). A large decline in bone mass occurs in older adults. In women, it occurs about the time of menopause. What actions can I take to keep my bones healthy? Good health habits are important for maintaining healthy bones. This includes eating nutritious foods and exercising regularly. To have healthy bones, you need to get enough of the right minerals and vitamins. Most nutrition  experts recommend getting these nutrients from the foods that you eat. In some cases, taking supplements may also be recommended. Doing certain types of exercise is also important for bone health. What are the nutritional recommendations for healthy bones?  Eating a well-balanced diet with plenty of calcium and vitamin D will help to protect your bones. Nutritional recommendations vary from person to person. Ask your health care provider what is healthy for you. Here are some general guidelines. Get enough calcium Calcium is the most important (essential) mineral for bone health. Most people can get enough calcium from their diet, but supplements may be recommended for people who are at risk for osteoporosis. Good sources of calcium include:  Dairy products, such as low-fat or nonfat milk, cheese, and yogurt.  Dark green leafy vegetables, such as bok choy and broccoli.  Calcium-fortified foods, such as orange juice, cereal, bread, soy beverages, and tofu products.  Nuts, such as almonds. Follow these recommended amounts for daily calcium intake:  Children, age 535-3: 700 mg.  Children, age 85-8: 1,000 mg.  Children, age 54-13: 1,300 mg.  Teens, age 56-18: 1,300 mg.  Adults, age 65-50: 1,000 mg.  Adults, age 58-70: ? Men: 1,000 mg. ? Women: 1,200 mg.  Adults, age 61 or older: 1,200 mg.  Pregnant and breastfeeding females: ? Teens: 1,300 mg. ? Adults: 1,000 mg. Get enough vitamin D Vitamin D is the most essential vitamin for bone health. It helps the body absorb calcium. Sunlight stimulates the skin to make vitamin D, so be sure to get enough sunlight. If you live in a cold climate or you do not get outside often, your health care provider may recommend that you take vitamin D supplements. Good sources of vitamin D in your diet include:  Egg yolks.  Saltwater fish.  Milk and cereal fortified with vitamin D. Follow these recommended amounts for daily vitamin D intake:  Children  and teens, age 535-18: 600 international units.  Adults, age 67 or younger: 400-800 international units.  Adults, age 46 or older: 800-1,000 international units. Get other important nutrients Other nutrients that are important for bone health include:  Phosphorus. This mineral is found in meat, poultry, dairy foods, nuts, and legumes. The recommended daily intake for adult men and adult women is 700 mg.  Magnesium. This mineral is found  in seeds, nuts, dark green vegetables, and legumes. The recommended daily intake for adult men is 400-420 mg. For adult women, it is 310-320 mg.  Vitamin K. This vitamin is found in green leafy vegetables. The recommended daily intake is 120 mg for adult men and 90 mg for adult women. What type of physical activity is best for building and maintaining healthy bones? Weight-bearing and strength-building activities are important for building and maintaining healthy bones. Weight-bearing activities cause muscles and bones to work against gravity. Strength-building activities increase the strength of the muscles that support bones. Weight-bearing and muscle-building activities include:  Walking and hiking.  Jogging and running.  Dancing.  Gym exercises.  Lifting weights.  Tennis and racquetball.  Climbing stairs.  Aerobics. Adults should get at least 30 minutes of moderate physical activity on most days. Children should get at least 60 minutes of moderate physical activity on most days. Ask your health care provider what type of exercise is best for you. How can I find out if my bone mass is low? Bone mass can be measured with an X-ray test called a bone mineral density (BMD) test. This test is recommended for all women who are age 55 or older. It may also be recommended for:  Men who are age 63 or older.  People who are at risk for osteoporosis because of: ? Having bones that break easily. ? Having a long-term disease that weakens bones, such as  kidney disease or rheumatoid arthritis. ? Having menopause earlier than normal. ? Taking medicine that weakens bones, such as steroids, thyroid hormones, or hormone treatment for breast cancer or prostate cancer. ? Smoking. ? Drinking three or more alcoholic drinks a day. If you find that you have a low bone mass, you may be able to prevent osteoporosis or further bone loss by changing your diet and lifestyle. Where can I find more information? For more information, check out the following websites:  [place]: AviationTales.fr  [employer] of Health: www.bones.SouthExposed.es  International Osteoporosis Foundation: Administrator.iofbonehealth.org Summary  The aging process leads to an overall loss of bone mass in the body, which can increase the likelihood of broken bones and osteoporosis.  Eating a well-balanced diet with plenty of calcium and vitamin D will help to protect your bones.  Weight-bearing and strength-building activities are also important for building and maintaining strong bones.  Bone mass can be measured with an X-ray test called a bone mineral density (BMD) test. This information is not intended to replace advice given to you by your health care provider. Make sure you discuss any questions you have with your health care provider. Document Revised: 06/13/2017 Document Reviewed: 06/13/2017 Elsevier Patient Education  2020 [employer].   Steps to Quit Smoking Smoking tobacco is the leading cause of preventable death. It can affect almost every organ in the body. Smoking puts you and those around you at risk for developing many serious chronic diseases. Quitting smoking can be difficult, but it is one of the best things that you can do for your health. It is never too late to quit. How do I get ready to quit? When you decide to quit smoking, create a plan to help you succeed. Before you quit:  Pick a date to quit. Set a date within the next 2 weeks  to give you time to prepare.  Write down the reasons why you are quitting. Keep this list in places where you will see it often.  Tell your family,  friends, and co-workers that you are quitting. Support from your loved ones can make quitting easier.  Talk with your health care provider about your options for quitting smoking.  Find out what treatment options are covered by your health insurance.  Identify people, places, things, and activities that make you want to smoke (triggers). Avoid them. What first steps can I take to quit smoking?  Throw away all cigarettes at home, at work, and in your car.  Throw away smoking accessories, such as Scientist, research (medical).  Clean your car. Make sure to empty the ashtray.  Clean your home, including curtains and carpets. What strategies can I use to quit smoking? Talk with your health care provider about combining strategies, such as taking medicines while you are also receiving in-person counseling. Using these two strategies together makes you more likely to succeed in quitting than if you used either strategy on its own.  If you are pregnant or breastfeeding, talk with your health care provider about finding counseling or other support strategies to quit smoking. Do not take medicine to help you quit smoking unless your health care provider tells you to do so. To quit smoking: Quit right away  Quit smoking completely, instead of gradually reducing how much you smoke over a period of time. Research shows that stopping smoking right away is more successful than gradually quitting.  Attend in-person counseling to help you build problem-solving skills. You are more likely to succeed in quitting if you attend counseling sessions regularly. Even short sessions of 10 minutes can be effective. Take medicine You may take medicines to help you quit smoking. Some medicines require a prescription and some you can purchase over-the-counter. Medicines may  have nicotine in them to replace the nicotine in cigarettes. Medicines may:  Help to stop cravings.  Help to relieve withdrawal symptoms. Your health care provider may recommend:  Nicotine patches, gum, or lozenges.  Nicotine inhalers or sprays.  Non-nicotine medicine that is taken by mouth. Find resources Find resources and support systems that can help you to quit smoking and remain smoke-free after you quit. These resources are most helpful when you use them often. They include:  Online chats with a Social worker.  Telephone quitlines.  Printed Furniture conservator/restorer.  Support groups or group counseling.  Text messaging programs.  Mobile phone apps or applications. Use apps that can help you stick to your quit plan by providing reminders, tips, and encouragement. There are many free apps for mobile devices as well as websites. Examples include Quit Guide from the [employer] and smokefree.gov What things can I do to make it easier to quit?   Reach out to your family and friends for support and encouragement. Call telephone quitlines (1-800-QUIT-NOW), reach out to support groups, or work with a counselor for support.  Ask people who smoke to avoid smoking around you.  Avoid places that trigger you to smoke, such as bars, parties, or smoke-break areas at work.  Spend time with people who do not smoke.  Lessen the stress in your life. Stress can be a smoking trigger for some people. To lessen stress, try: ? Exercising regularly. ? Doing deep-breathing exercises. ? Doing yoga. ? Meditating. ? Performing a body scan. This involves closing your eyes, scanning your body from head to toe, and noticing which parts of your body are particularly tense. Try to relax the muscles in those areas. How will I feel when I quit smoking? Day 1 to 3 weeks Within the first 24  hours of quitting smoking, you may start to feel withdrawal symptoms. These symptoms are usually most noticeable 2-3 days after  quitting, but they usually do not last for more than 2-3 weeks. You may experience these symptoms:  Mood swings.  Restlessness, anxiety, or irritability.  Trouble concentrating.  Dizziness.  Strong cravings for sugary foods and nicotine.  Mild weight gain.  Constipation.  Nausea.  Coughing or a sore throat.  Changes in how the medicines that you take for unrelated issues work in your body.  Depression.  Trouble sleeping (insomnia). Week 3 and afterward After the first 2-3 weeks of quitting, you may start to notice more positive results, such as:  Improved sense of smell and taste.  Decreased coughing and sore throat.  Slower heart rate.  Lower blood pressure.  Clearer skin.  The ability to breathe more easily.  Fewer sick days. Quitting smoking can be very challenging. Do not get discouraged if you are not successful the first time. Some people need to make many attempts to quit before they achieve long-term success. Do your best to stick to your quit plan, and talk with your health care provider if you have any questions or concerns. Summary  Smoking tobacco is the leading cause of preventable death. Quitting smoking is one of the best things that you can do for your health.  When you decide to quit smoking, create a plan to help you succeed.  Quit smoking right away, not slowly over a period of time.  When you start quitting, seek help from your health care provider, family, or friends. This information is not intended to replace advice given to you by your health care provider. Make sure you discuss any questions you have with your health care provider. Document Revised: 02/09/2019 Document Reviewed: 08/05/2018 Elsevier Patient Education  [place] with Quitting Smoking  Quitting smoking is a physical and mental challenge. You will face cravings, withdrawal symptoms, and temptation. Before quitting, work with your health care provider to  make a plan that can help you cope. Preparation can help you quit and keep you from giving in. How can I cope with cravings? Cravings usually last for 5-10 minutes. If you get through it, the craving will pass. Consider taking the following actions to help you cope with cravings:  Keep your mouth busy: ? Chew sugar-free gum. ? Suck on hard candies or a straw. ? Brush your teeth.  Keep your hands and body busy: ? Immediately change to a different activity when you feel a craving. ? Squeeze or play with a ball. ? Do an activity or a hobby, like making bead jewelry, practicing needlepoint, or working with wood. ? Mix up your normal routine. ? Take a short exercise break. Go for a quick walk or run up and down stairs. ? Spend time in public places where smoking is not allowed.  Focus on doing something kind or helpful for someone else.  Call a friend or family member to talk during a craving.  Join a support group.  Call a quit line, such as 1-800-QUIT-NOW.  Talk with your health care provider about medicines that might help you cope with cravings and make quitting easier for you. How can I deal with withdrawal symptoms? Your body may experience negative effects as it tries to get used to not having nicotine in the system. These effects are called withdrawal symptoms. They may include:  Feeling hungrier than normal.  Trouble concentrating.  Irritability.  Trouble sleeping.  Feeling depressed.  Restlessness and agitation.  Craving a cigarette. To manage withdrawal symptoms:  Avoid places, people, and activities that trigger your cravings.  Remember why you want to quit.  Get plenty of sleep.  Avoid coffee and other caffeinated drinks. These may worsen some of your symptoms. How can I handle social situations? Social situations can be difficult when you are quitting smoking, especially in the first few weeks. To manage this, you can:  Avoid parties, bars, and other  social situations where people might be smoking.  Avoid alcohol.  Leave right away if you have the urge to smoke.  Explain to your family and friends that you are quitting smoking. Ask for understanding and support.  Plan activities with friends or family where smoking is not an option. What are some ways I can cope with stress? Wanting to smoke may cause stress, and stress can make you want to smoke. Find ways to manage your stress. Relaxation techniques can help. For example:  Breathe slowly and deeply, in through your nose and out through your mouth.  Listen to soothing, relaxing music.  Talk with a family member or friend about your stress.  Light a candle.  Soak in a bath or take a shower.  Think about a peaceful place. What are some ways I can prevent weight gain? Be aware that many people gain weight after they quit smoking. However, not everyone does. To keep from gaining weight, have a plan in place before you quit and stick to the plan after you quit. Your plan should include:  Having healthy snacks. When you have a craving, it may help to: ? Eat plain popcorn, crunchy carrots, celery, or other cut vegetables. ? Chew sugar-free gum.  Changing how you eat: ? Eat small portion sizes at meals. ? Eat 4-6 small meals throughout the day instead of 1-2 large meals a day. ? Be mindful when you eat. Do not watch television or do other things that might distract you as you eat.  Exercising regularly: ? Make time to exercise each day. If you do not have time for a long workout, do short bouts of exercise for 5-10 minutes several times a day. ? Do some form of strengthening exercise, like weight lifting, and some form of aerobic exercise, like running or swimming.  Drinking plenty of water or other low-calorie or no-calorie drinks. Drink 6-8 glasses of water daily, or as much as instructed by your health care provider. Summary  Quitting smoking is a physical and mental  challenge. You will face cravings, withdrawal symptoms, and temptation to smoke again. Preparation can help you as you go through these challenges.  You can cope with cravings by keeping your mouth busy (such as by chewing gum), keeping your body and hands busy, and making calls to family, friends, or a helpline for people who want to quit smoking.  You can cope with withdrawal symptoms by avoiding places where people smoke, avoiding drinks with caffeine, and getting plenty of rest.  Ask your health care provider about the different ways to prevent weight gain, avoid stress, and handle social situations. This information is not intended to replace advice given to you by your health care provider. Make sure you discuss any questions you have with your health care provider. Document Revised: 04/29/2017 Document Reviewed: 05/14/2016 Elsevier Patient Education  2020 [employer].

## 2019-12-17 NOTE — Progress Notes (Signed)
Gynecology Annual Exam  PCP: Doreen Beam, FNP  Chief Complaint:  Chief Complaint  Patient presents with  . Gynecologic Exam    History of Present Illness: Patient is a 44 y.o. No obstetric history on file. presents for annual exam. The patient has no complaints today.   LMP: Patient's last menstrual period was 11/16/2019.  Review of Systems: ROS  Past Medical History:  Patient Active Problem List   Diagnosis Date Noted  . Acute conjunctivitis of right eye 11/27/2018  . Other forms of systemic lupus erythematosus ([place]) 09/20/2016    Formatting of this note might be different from the original. Arthralgias, myalgias, fatigue, + ANA, + Ds DNA   . Vitamin D deficiency 05/03/2016  . Thrombocytosis ([place]) 04/15/2016  . Myalgia 04/15/2016  . Low TSH level 04/15/2016  . Elevated erythrocyte sedimentation rate 04/15/2016  . Chronic fatigue 04/15/2016  . ANA positive 04/07/2016  . Arthralgia of multiple sites 04/05/2016    With some swelling   . Microcytic anemia 04/05/2016  . Sickle cell trait ([place]) 04/05/2016    Past Surgical History:  Past Surgical History:  Procedure Laterality Date  . IRRIGATION AND DEBRIDEMENT ABSCESS     R buttock    Gynecologic History:  Patient's last menstrual period was 11/16/2019. Obstetric History: No obstetric history on file.  Family History:  Family History  Problem Relation Age of Onset  . Diabetes Mother   . Sickle cell trait Father   . Cancer Maternal Grandfather        colon  . Diabetes Maternal Aunt   . Diabetes Maternal Grandmother   . Breast cancer Neg Hx     Social History:  Social History   Socioeconomic History  . Marital status: Single    Spouse name: Not on file  . Number of children: Not on file  . Years of education: Not on file  . Highest education level: Not on file  Occupational History  . Not on file  Tobacco Use  . Smoking status: Current Every Day Smoker    Packs/day: 0.10    Types:  Cigarettes  . Smokeless tobacco: Never Used  Substance and Sexual Activity  . Alcohol use: Yes    Alcohol/week: 0.0 standard drinks    Comment: occasionally  . Drug use: No  . Sexual activity: Yes    Birth control/protection: None  Other Topics Concern  . Not on file  Social History Narrative  . Not on file   Social Determinants of Health   Financial Resource Strain:   . Difficulty of Paying Living Expenses:   Food Insecurity:   . Worried About Charity fundraiser in the Last Year:   . Arboriculturist in the Last Year:   Transportation Needs:   . Film/video editor (Medical):   Marland Kitchen Lack of Transportation (Non-Medical):   Physical Activity:   . Days of Exercise per Week:   . Minutes of Exercise per Session:   Stress:   . Feeling of Stress :   Social Connections:   . Frequency of Communication with Friends and Family:   . Frequency of Social Gatherings with Friends and Family:   . Attends Religious Services:   . Active Member of Clubs or Organizations:   . Attends Archivist Meetings:   Marland Kitchen Marital Status:   Intimate Partner Violence:   . Fear of Current or Ex-Partner:   . Emotionally Abused:   Marland Kitchen Physically Abused:   .  Sexually Abused:     Allergies:  No Known Allergies  Medications: Prior to Admission medications   Medication Sig Start Date End Date Taking? Authorizing Provider  Calcium Carbonate-Vit D-Min (CALCIUM 1200 PO) Take by mouth daily.   Yes [provider]  ergocalciferol (VITAMIN D2) 1.25 MG (50000 UT) capsule Take one capsule once a week for 12 weeks 08/07/19  Yes [provider]  ferrous fumarate (HEMOCYTE - 106 MG FE) 325 (106 Fe) MG TABS tablet Take 1 tablet (106 mg of iron total) by mouth 2 (two) times daily. 08/30/16  Yes Copland, Frederico Hamman, MD  hydroxychloroquine (PLAQUENIL) 200 MG tablet Take 200 mg by mouth 2 (two) times daily. 05/10/19  Yes [provider]  Multiple Vitamins-Minerals (MULTIVITAMIN GUMMIES ADULTS  PO) Take by mouth daily.   Yes [provider]  PROTOPIC 0.1 % ointment Apply topically 2 (two) times daily. 10/18/19  Yes Brendolyn Patty, MD  tacrolimus (PROTOPIC) 0.1 % ointment Apply to aa's eyelids BID PRN 10/15/19  Yes Brendolyn Patty, MD    Physical Exam Vitals: Blood pressure 130/80, pulse 64, resp. rate 18, height 5\' 5"  (1.651 m), weight 154 lb 3.2 oz (69.9 kg), last menstrual period 11/16/2019, SpO2 97 %.  General: NAD HEENT: normocephalic, anicteric Thyroid: no enlargement, no palpable nodules Pulmonary: No increased work of breathing, CTAB Cardiovascular: RRR, distal pulses 2+ Breast: Breast symmetrical, no tenderness, no palpable nodules or masses, no skin or nipple retraction present, no nipple discharge.  No axillary or supraclavicular lymphadenopathy. Abdomen: NABS, soft, non-tender, non-distended.  Umbilicus without lesions.  No hepatomegaly, splenomegaly or masses palpable. No evidence of hernia  Genitourinary:  External: Normal external female genitalia.  Normal urethral meatus, normal Bartholin's and Skene's glands.    Vagina: Normal vaginal mucosa, no evidence of prolapse.    Cervix: Grossly normal in appearance, no bleeding  Uterus: Non-enlarged, mobile, normal contour.  No CMT  Adnexa: ovaries non-enlarged, no adnexal masses  Rectal: deferred  Lymphatic: no evidence of inguinal lymphadenopathy Extremities: no edema, erythema, or tenderness Neurologic: Grossly intact Psychiatric: mood appropriate, affect full  Female chaperone present for pelvic and breast  portions of the physical exam    Assessment: 44 y.o. No obstetric history on file. routine annual exam  Plan: Problem List Items Addressed This Visit    None    Visit Diagnoses    Health maintenance examination    -  Primary   Breast cancer screening by mammogram       Relevant Orders   MM 3D SCREEN BREAST BILATERAL   Colon cancer screening       Relevant Orders   Ambulatory referral to  Gastroenterology   Screen for STD (sexually transmitted disease)       Relevant Orders   HIV antibody (with reflex)   RPR   Hepatitis panel, acute   NuSwab Vaginitis Plus (VG+)   Encounter for screening breast examination       Encounter for gynecological examination without abnormal finding       Encounter for cervical Pap smear with pelvic exam       Relevant Orders   Cytology - PAP   Cervical cancer screening       Relevant Orders   Cytology - PAP      1) Mammogram - recommend yearly screening mammogram.  Mammogram Was ordered today   2) STI screening  wasoffered and accepted  3) ASCCP guidelines and rational discussed.  Pap today.   4) Colonoscopy -referral placed  6) Routine healthcare maintenance including cholesterol, diabetes screening discussed managed by PCP  7) No follow-ups on file.  Would like to biopsy cervix once pap smear results come back- abnormal appearance Hx of syphilis and LUPUS, has positive titer   [place] 12/17/2019, 11:37 AM

## 2019-12-18 LAB — HEPATITIS PANEL, ACUTE
Hep A IgM: NEGATIVE
Hep B C IgM: NEGATIVE
Hep C Virus Ab: <0.1 s/co ratio (ref 0.0–0.9)
Hepatitis B Surface Ag: NEGATIVE

## 2019-12-18 LAB — RPR: RPR Ser Ql: NONREACTIVE

## 2019-12-18 LAB — HIV ANTIBODY (ROUTINE TESTING W REFLEX): HIV Screen 4th Generation wRfx: NONREACTIVE

## 2019-12-19 LAB — CYTOLOGY - PAP
Comment: NEGATIVE
Diagnosis: NEGATIVE
High risk HPV: NEGATIVE

## 2019-12-20 LAB — NUSWAB VAGINITIS PLUS (VG+)
Atopobium vaginae: HIGH Score — AB
BVAB 2: HIGH Score — AB
Candida albicans, NAA: NEGATIVE
Candida glabrata, NAA: NEGATIVE
Chlamydia trachomatis, NAA: NEGATIVE
Megasphaera 1: HIGH Score — AB
Neisseria gonorrhoeae, NAA: NEGATIVE
Trich vag by NAA: NEGATIVE

## 2019-12-26 ENCOUNTER — Other Ambulatory Visit: Payer: Self-pay

## 2019-12-26 ENCOUNTER — Telehealth (INDEPENDENT_AMBULATORY_CARE_PROVIDER_SITE_OTHER): Payer: Self-pay | Admitting: Gastroenterology

## 2019-12-26 DIAGNOSIS — Z1211 Encounter for screening for malignant neoplasm of colon: Secondary | ICD-10-CM

## 2019-12-26 MED ORDER — NA SULFATE-K SULFATE-MG SULF 17.5-3.13-1.6 GM/177ML PO SOLN: 1.0000 | Freq: Once | ORAL | 0 refills | Status: AC

## 2019-12-26 NOTE — Progress Notes (Signed)
Gastroenterology Pre-Procedure Review  Request Date: Monday 02/11/20 Requesting Physician: Dr. Marius Ditch  PATIENT REVIEW QUESTIONS: The patient responded to the following health history questions as indicated:    1. Are you having any GI issues? no 2. Do you have a personal history of Polyps? no 3. Do you have a family history of Colon Cancer or Polyps? no 4. Diabetes Mellitus? no 5. Joint replacements in the past 12 months?no 6. Major health problems in the past 3 months?no 7. Any artificial heart valves, MVP, or defibrillator?no    MEDICATIONS & ALLERGIES:    Patient reports the following regarding taking any anticoagulation/antiplatelet therapy:   Plavix, Coumadin, Eliquis, Xarelto, Lovenox, Pradaxa, Brilinta, or Effient? no Aspirin? no  Patient confirms/reports the following medications:  Current Outpatient Medications  Medication Sig Dispense Refill  . Calcium Carbonate-Vit D-Min (CALCIUM 1200 PO) Take by mouth daily.    . ergocalciferol (VITAMIN D2) 1.25 MG (50000 UT) capsule Take one capsule once a week for 12 weeks    . ferrous fumarate (HEMOCYTE - 106 MG FE) 325 (106 Fe) MG TABS tablet Take 1 tablet (106 mg of iron total) by mouth 2 (two) times daily. 60 tablet 1  . hydroxychloroquine (PLAQUENIL) 200 MG tablet Take 200 mg by mouth 2 (two) times daily.    . Multiple Vitamins-Minerals (MULTIVITAMIN GUMMIES ADULTS PO) Take by mouth daily.    Marland Kitchen PROTOPIC 0.1 % ointment Apply topically 2 (two) times daily. 100 g 0  . tacrolimus (PROTOPIC) 0.1 % ointment Apply to aa's eyelids BID PRN 60 g 0   No current facility-administered medications for this visit.    Patient confirms/reports the following allergies:  No Known Allergies  No orders of the defined types were placed in this encounter.   AUTHORIZATION INFORMATION Primary Insurance: 1D#: Group #:  Secondary Insurance: 1D#: Group #:  SCHEDULE INFORMATION: Date: 02/11/20 Time: Location:[facility]

## 2019-12-27 ENCOUNTER — Other Ambulatory Visit: Payer: Self-pay | Admitting: Dermatology

## 2020-01-14 ENCOUNTER — Telehealth: Payer: Self-pay

## 2020-01-14 NOTE — Telephone Encounter (Signed)
Pt calling; has procedure on 18th; what is it and why does she need it?  [phone]

## 2020-01-14 NOTE — Telephone Encounter (Signed)
Pt aware colpo will be done b/c cx looked abnl to CRS.  Colpo described to pt as well.

## 2020-01-16 ENCOUNTER — Encounter: Payer: Self-pay | Admitting: Obstetrics and Gynecology

## 2020-01-16 ENCOUNTER — Other Ambulatory Visit (HOSPITAL_COMMUNITY)
Admission: RE | Admit: 2020-01-16 | Discharge: 2020-01-16 | Disposition: A | Discharge status: Home / Self Care | Payer: Medicaid Other | Source: Ambulatory Visit | Attending: Obstetrics and Gynecology | Admitting: Obstetrics and Gynecology

## 2020-01-16 ENCOUNTER — Other Ambulatory Visit: Payer: Self-pay

## 2020-01-16 ENCOUNTER — Ambulatory Visit (INDEPENDENT_AMBULATORY_CARE_PROVIDER_SITE_OTHER): Payer: Medicaid Other | Admitting: Obstetrics and Gynecology

## 2020-01-16 VITALS — BP 116/70 | Ht 65.0 in | Wt 154.2 lb

## 2020-01-16 DIAGNOSIS — N841 Polyp of cervix uteri: Secondary | ICD-10-CM | POA: Insufficient documentation

## 2020-01-16 DIAGNOSIS — N889 Noninflammatory disorder of cervix uteri, unspecified: Secondary | ICD-10-CM | POA: Diagnosis not present

## 2020-01-16 NOTE — Progress Notes (Signed)
   GYNECOLOGY CLINIC COLPOSCOPY PROCEDURE NOTE  44 y.o. No obstetric history on file. Here for colposcopy for NIL and HR HPV negative  pap smear on 12/17/2019. She had an abnormal appearance of her cervix at that time and I advised colposcopy based on the appearance of the cervix. Discussed underlying role for HPV infection in the development of cervical dysplasia, its natural history and progression/regression, need for surveillance.  Is the patient  pregnant: No LMP: No LMP recorded. Smoking status:  reports that she has been smoking cigarettes. She has been smoking about 0.10 packs per day. She has never used smokeless tobacco. Contraception: none Future fertility desired:  Yes  Patient given informed consent, signed copy in the chart, time out was performed.  The patient was position in dorsal lithotomy position. Speculum was placed the cervix was visualized.   After application of acetic acid colposcopic inspection of the cervix was undertaken.   Colposcopy adequate, full visualization of transformation zone: Yes  Acetowhite lesion(s) noted at 12 o'clock, highlited with lugol's and endocervical polyp ; corresponding biopsies obtained.   ECC specimen obtained:  Yes  All specimens were labeled and sent to pathology.   Patient was given post procedure instructions.  Will follow up pathology and manage accordingly.  Routine preventative health maintenance measures emphasized.  Physical Exam Genitourinary:     Exam conducted with a chaperone present.     Adrian Prows MD [facility], [facility] 01/16/2020 8:48 AM

## 2020-01-16 NOTE — Patient Instructions (Signed)
Colposcopy, Care After This sheet gives you information about how to care for yourself after your procedure. Your doctor may also give you more specific instructions. If you have problems or questions, contact your doctor. What can I expect after the procedure? If you did not have a tissue sample removed (did not have a biopsy), you may only have some spotting for a few days. You can go back to your normal activities. If you had a tissue sample removed, it is common to have:  Soreness and pain. This may last for a few days.  Light-headedness.  Mild bleeding from your vagina or dark-colored, grainy discharge from your vagina. This may last for a few days. You may need to wear a sanitary pad.  Spotting for at least 48 hours after the procedure. Follow these instructions at home:   Take over-the-counter and prescription medicines only as told by your doctor. Ask your doctor what medicines you can start taking again. This is very important if you take blood-thinning medicine.  Do not drive or use heavy machinery while taking prescription pain medicine.  For 3 days, or as long as your doctor tells you, avoid: ? Douching. ? Using tampons. ? Having sex.  If you use birth control (contraception), keep using it.  Limit activity for the first day after the procedure. Ask your doctor what activities are safe for you.  It is up to you to get the results of your procedure. Ask your doctor when your results will be ready.  Keep all follow-up visits as told by your doctor. This is important. Contact a doctor if:  You get a skin rash. Get help right away if:  You are bleeding a lot from your vagina. It is a lot of bleeding if you are using more than one pad an hour for 2 hours in a row.  You have clumps of blood (blood clots) coming from your vagina.  You have a fever.  You have chills  You have pain in your lower belly (pelvic area).  You have signs of infection, such as vaginal  discharge that is: ? Different than usual. ? Yellow. ? Bad-smelling.  You have very pain or cramps in your lower belly that do not get better with medicine.  You feel light-headed.  You feel dizzy.  You pass out (faint). Summary  If you did not have a tissue sample removed (did not have a biopsy), you may only have some spotting for a few days. You can go back to your normal activities.  If you had a tissue sample removed, it is common to have mild pain and spotting for 48 hours.  For 3 days, or as long as your doctor tells you, avoid douching, using tampons and having sex.  Get help right away if you have bleeding, very bad pain, or signs of infection. This information is not intended to replace advice given to you by your health care provider. Make sure you discuss any questions you have with your health care provider. Document Revised: 04/29/2017 Document Reviewed: 02/04/2016 Elsevier Patient Education  2020 [employer]  

## 2020-01-17 LAB — SURGICAL PATHOLOGY

## 2020-02-07 ENCOUNTER — Other Ambulatory Visit: Admission: RE | Admit: 2020-02-07 | Payer: Medicaid Other | Source: Ambulatory Visit

## 2020-02-08 ENCOUNTER — Telehealth: Payer: Self-pay

## 2020-02-08 DIAGNOSIS — Z1211 Encounter for screening for malignant neoplasm of colon: Secondary | ICD-10-CM

## 2020-02-08 NOTE — Telephone Encounter (Signed)
Patient was contacted to remind to go for COVID testing this am to keep her colonoscopy as scheduled for Monday with Dr. Marius Ditch.  She stated that she would like to reschedule to 02/25/20.  Pt has been advised to have COVID test on Thursday 02/21/20.  Trish in Endo has been informed.  New instructions have been prepared and sent via mychart.  Thanks,  [place], [place]

## 2020-02-21 ENCOUNTER — Other Ambulatory Visit: Payer: Self-pay

## 2020-02-21 ENCOUNTER — Other Ambulatory Visit
Admission: RE | Admit: 2020-02-21 | Discharge: 2020-02-21 | Disposition: A | Discharge status: Home / Self Care | Payer: Medicaid Other | Source: Ambulatory Visit | Attending: Gastroenterology | Admitting: Gastroenterology

## 2020-02-21 DIAGNOSIS — Z20822 Contact with and (suspected) exposure to covid-19: Secondary | ICD-10-CM | POA: Diagnosis not present

## 2020-02-21 DIAGNOSIS — Z01812 Encounter for preprocedural laboratory examination: Secondary | ICD-10-CM | POA: Diagnosis not present

## 2020-02-21 LAB — SARS CORONAVIRUS 2 (TAT 6-24 HRS): SARS Coronavirus 2: NEGATIVE

## 2020-02-25 ENCOUNTER — Ambulatory Visit
Admission: RE | Admit: 2020-02-25 | Discharge: 2020-02-25 | Disposition: A | Discharge status: Home / Self Care | Payer: Medicaid Other | Attending: Gastroenterology | Admitting: Gastroenterology

## 2020-02-25 ENCOUNTER — Ambulatory Visit: Payer: Medicaid Other | Admitting: Anesthesiology

## 2020-02-25 ENCOUNTER — Other Ambulatory Visit: Payer: Self-pay

## 2020-02-25 ENCOUNTER — Encounter
Admission: RE | Disposition: A | Discharge status: Home / Self Care | Payer: Self-pay | Source: Home / Self Care | Attending: Gastroenterology

## 2020-02-25 ENCOUNTER — Encounter: Payer: Self-pay | Admitting: Gastroenterology

## 2020-02-25 DIAGNOSIS — Z1211 Encounter for screening for malignant neoplasm of colon: Secondary | ICD-10-CM | POA: Diagnosis not present

## 2020-02-25 DIAGNOSIS — Z832 Family history of diseases of the blood and blood-forming organs and certain disorders involving the immune mechanism: Secondary | ICD-10-CM | POA: Insufficient documentation

## 2020-02-25 DIAGNOSIS — F17210 Nicotine dependence, cigarettes, uncomplicated: Secondary | ICD-10-CM | POA: Diagnosis not present

## 2020-02-25 DIAGNOSIS — Z8619 Personal history of other infectious and parasitic diseases: Secondary | ICD-10-CM | POA: Insufficient documentation

## 2020-02-25 DIAGNOSIS — Z800 Family history of malignant neoplasm of digestive organs: Secondary | ICD-10-CM | POA: Diagnosis not present

## 2020-02-25 DIAGNOSIS — Z79899 Other long term (current) drug therapy: Secondary | ICD-10-CM | POA: Insufficient documentation

## 2020-02-25 DIAGNOSIS — D649 Anemia, unspecified: Secondary | ICD-10-CM | POA: Diagnosis not present

## 2020-02-25 DIAGNOSIS — D125 Benign neoplasm of sigmoid colon: Secondary | ICD-10-CM | POA: Diagnosis not present

## 2020-02-25 DIAGNOSIS — D573 Sickle-cell trait: Secondary | ICD-10-CM | POA: Insufficient documentation

## 2020-02-25 DIAGNOSIS — K635 Polyp of colon: Secondary | ICD-10-CM | POA: Diagnosis not present

## 2020-02-25 DIAGNOSIS — M329 Systemic lupus erythematosus, unspecified: Secondary | ICD-10-CM | POA: Diagnosis not present

## 2020-02-25 HISTORY — DX: Reserved for concepts with insufficient information to code with codable children: IMO0002

## 2020-02-25 HISTORY — DX: Systemic lupus erythematosus, unspecified: M32.9

## 2020-02-25 HISTORY — PX: COLONOSCOPY WITH PROPOFOL: SHX5780

## 2020-02-25 LAB — POCT PREGNANCY, URINE: Preg Test, Ur: NEGATIVE

## 2020-02-25 SURGERY — COLONOSCOPY WITH PROPOFOL
Anesthesia: General

## 2020-02-25 MED ORDER — PROPOFOL 10 MG/ML IV BOLUS
INTRAVENOUS | Status: DC | PRN
  Administered 2020-02-25: 70 mg via INTRAVENOUS

## 2020-02-25 MED ORDER — SODIUM CHLORIDE 0.9 % IV SOLN: INTRAVENOUS | Status: DC

## 2020-02-25 MED ORDER — PROPOFOL 10 MG/ML IV BOLUS
INTRAVENOUS | Status: AC
  Filled 2020-02-25: qty 20

## 2020-02-25 MED ORDER — PROPOFOL 500 MG/50ML IV EMUL
INTRAVENOUS | Status: DC | PRN
  Administered 2020-02-25: 150 ug/kg/min via INTRAVENOUS

## 2020-02-25 MED ORDER — PROPOFOL 500 MG/50ML IV EMUL
INTRAVENOUS | Status: AC
  Filled 2020-02-25: qty 50

## 2020-02-25 NOTE — Anesthesia Preprocedure Evaluation (Addendum)
Anesthesia Evaluation  Patient identified by MRN, date of birth, ID band Patient awake    Reviewed: Allergy & Precautions, NPO status , Patient's Chart, lab work & pertinent test results  History of Anesthesia Complications Negative for: history of anesthetic complications  Airway Mallampati: III       Dental   Pulmonary neg sleep apnea, neg COPD, Current Smoker and Patient abstained from smoking.,           Cardiovascular (-) hypertension(-) Past MI and (-) CHF (-) dysrhythmias (-) Valvular Problems/Murmurs     Neuro/Psych neg Seizures    GI/Hepatic Neg liver ROS, neg GERD  ,  Endo/Other  neg diabetes  Renal/GU negative Renal ROS     Musculoskeletal   Abdominal   Peds  Hematology  (+) anemia ,   Anesthesia Other Findings   Reproductive/Obstetrics                            Anesthesia Physical Anesthesia Plan  ASA: I  Anesthesia Plan: General   Post-op Pain Management:    Induction: Intravenous  PONV Risk Score and Plan: 2 and Propofol infusion and TIVA  Airway Management Planned: Nasal Cannula  Additional Equipment:   Intra-op Plan:   Post-operative Plan:   Informed Consent: I have reviewed the patients History and Physical, chart, labs and discussed the procedure including the risks, benefits and alternatives for the proposed anesthesia with the patient or authorized representative who has indicated his/her understanding and acceptance.       Plan Discussed with:   Anesthesia Plan Comments:         Anesthesia Quick Evaluation

## 2020-02-25 NOTE — Transfer of Care (Signed)
Immediate Anesthesia Transfer of Care Note  Patient: Wendy Hogan  Procedure(s) Performed: COLONOSCOPY WITH PROPOFOL (N/A )  Patient Location: PACU and Endoscopy Unit  Anesthesia Type:General  Level of Consciousness: drowsy  Airway & Oxygen Therapy: Patient Spontanous Breathing  Post-op Assessment: Report given to RN  Post vital signs: stable  Last Vitals:  Vitals Value Taken Time  BP    Temp    Pulse    Resp    SpO2      Last Pain:  Vitals:   02/25/20 0749  TempSrc: Temporal  PainSc: 0-No pain         Complications: No complications documented.

## 2020-02-25 NOTE — H&P (Signed)
Cephas Darby, MD [address]  [place]  [place], [place] [address]  Main: [phone]  Fax: [phone] Pager: [phone]  Primary Care Physician:  Doreen Beam, FNP Primary Gastroenterologist:  Dr. Cephas Darby  Pre-Procedure History & Physical: HPI:  Wendy Hogan is a 44 y.o. female is here for an colonoscopy   Past Medical History:  Diagnosis Date  . Abscess    gluteal  . Anemia   . Lupus ([place])   . Non-healing surgical wound    R buttock  . Sickle cell anemia ([facility])    trait  . Sickle cell trait ([place])   . Syphilis     Past Surgical History:  Procedure Laterality Date  . IRRIGATION AND DEBRIDEMENT ABSCESS     R buttock    Prior to Admission medications   Medication Sig Start Date End Date Taking? Authorizing Provider  Calcium Carbonate-Vit D-Min (CALCIUM 1200 PO) Take by mouth daily.   Yes [provider]  ergocalciferol (VITAMIN D2) 1.25 MG (50000 UT) capsule Take one capsule once a week for 12 weeks 08/07/19  Yes [provider]  ferrous fumarate (HEMOCYTE - 106 MG FE) 325 (106 Fe) MG TABS tablet Take 1 tablet (106 mg of iron total) by mouth 2 (two) times daily. 08/30/16  Yes Copland, Frederico Hamman, MD  hydroxychloroquine (PLAQUENIL) 200 MG tablet Take 200 mg by mouth 2 (two) times daily. 05/10/19  Yes [provider]  Multiple Vitamins-Minerals (MULTIVITAMIN GUMMIES ADULTS PO) Take by mouth daily.   Yes [provider]  PROTOPIC 0.1 % ointment Apply topically 2 (two) times daily. 10/18/19  Yes Brendolyn Patty, MD  tacrolimus (PROTOPIC) 0.1 % ointment APPLY TO AFFECTED EYELIDS TWICE A DAY AS NEEDED 12/27/19  Yes Brendolyn Patty, MD    Allergies as of 12/27/2019  . (No Known Allergies)    Family History  Problem Relation Age of Onset  . Diabetes Mother   . Sickle cell trait Father   . Cancer Maternal Grandfather        colon  . Diabetes Maternal Aunt   . Diabetes Maternal Grandmother   . Breast cancer Neg Hx      Social History   Socioeconomic History  . Marital status: Single    Spouse name: Not on file  . Number of children: Not on file  . Years of education: Not on file  . Highest education level: Not on file  Occupational History  . Not on file  Tobacco Use  . Smoking status: Current Every Day Smoker    Packs/day: 0.10    Types: Cigarettes  . Smokeless tobacco: Never Used  Vaping Use  . Vaping Use: Never used  Substance and Sexual Activity  . Alcohol use: Yes    Alcohol/week: 0.0 standard drinks    Comment: occasionally  . Drug use: No  . Sexual activity: Yes    Birth control/protection: None  Other Topics Concern  . Not on file  Social History Narrative  . Not on file   Social Determinants of Health   Financial Resource Strain:   . Difficulty of Paying Living Expenses: Not on file  Food Insecurity:   . Worried About Charity fundraiser in the Last Year: Not on file  . Ran Out of Food in the Last Year: Not on file  Transportation Needs:   . Lack of Transportation (Medical): Not on file  . Lack of Transportation (Non-Medical): Not on file  Physical Activity:   .  Days of Exercise per Week: Not on file  . Minutes of Exercise per Session: Not on file  Stress:   . Feeling of Stress : Not on file  Social Connections:   . Frequency of Communication with Friends and Family: Not on file  . Frequency of Social Gatherings with Friends and Family: Not on file  . Attends Religious Services: Not on file  . Active Member of Clubs or Organizations: Not on file  . Attends Archivist Meetings: Not on file  . Marital Status: Not on file  Intimate Partner Violence:   . Fear of Current or Ex-Partner: Not on file  . Emotionally Abused: Not on file  . Physically Abused: Not on file  . Sexually Abused: Not on file    Review of Systems: See HPI, otherwise negative ROS  Physical Exam: BP (!) 130/100   Pulse 83   Temp (!) 96.9 F (36.1 C) (Temporal)   Resp 17   Ht  5\' 5"  (1.651 m)   Wt 63.5 kg   SpO2 98%   BMI 23.30 kg/m  General:   Alert,  pleasant and cooperative in NAD Head:  Normocephalic and atraumatic. Neck:  Supple; no masses or thyromegaly. Lungs:  Clear throughout to auscultation.    Heart:  Regular rate and rhythm. Abdomen:  Soft, nontender and nondistended. Normal bowel sounds, without guarding, and without rebound.   Neurologic:  Alert and  oriented x4;  grossly normal neurologically.  Impression/Plan: HEYLEE TANT is here for an colonoscopy to be performed for colon cancer screening  Risks, benefits, limitations, and alternatives regarding  colonoscopy have been reviewed with the patient.  Questions have been answered.  All parties agreeable.   Sherri Sear, MD  02/25/2020, 8:18 AM

## 2020-02-25 NOTE — Anesthesia Postprocedure Evaluation (Signed)
Anesthesia Post Note  Patient: Wendy Hogan [facility]  Procedure(s) Performed: COLONOSCOPY WITH PROPOFOL (N/A )  Patient location during evaluation: Endoscopy Anesthesia Type: General Level of consciousness: awake and alert Pain management: pain level controlled Vital Signs Assessment: post-procedure vital signs reviewed and stable Respiratory status: spontaneous breathing and respiratory function stable Cardiovascular status: stable Anesthetic complications: no   No complications documented.   Last Vitals:  Vitals:   02/25/20 0749 02/25/20 0842  BP: (!) 130/100 105/69  Pulse: 83   Resp: 17   Temp: (!) 36.1 C (!) 36.2 C  SpO2: 98%     Last Pain:  Vitals:   02/25/20 0842  TempSrc: Temporal  PainSc:                  Ciella Obi K

## 2020-02-25 NOTE — Op Note (Signed)
[facility] Gastroenterology Patient Name: Wendy Hogan Procedure Date: 02/25/2020 8:15 AM MRN: [id number] Account #: [id number] Date of Birth: [birth year] Admit Type: Outpatient Age: 44 Room: [facility] ENDO ROOM 1 Gender: Female Note Status: Finalized Procedure:             Colonoscopy Indications:           Screening for colorectal malignant neoplasm, This is                         the patient's first colonoscopy Providers:             Lin Landsman MD, MD Medicines:             Monitored Anesthesia Care Complications:         No immediate complications. Estimated blood loss: None. Procedure:             Pre-Anesthesia Assessment:                        - Prior to the procedure, a History and Physical was                         performed, and patient medications and allergies were                         reviewed. The patient is competent. The risks and                         benefits of the procedure and the sedation options and                         risks were discussed with the patient. All questions                         were answered and informed consent was obtained.                         Patient identification and proposed procedure were                         verified by the physician, the nurse, the                         anesthesiologist, the anesthetist and the technician                         in the pre-procedure area in the procedure room in the                         endoscopy suite. Mental Status Examination: alert and                         oriented. Airway Examination: normal oropharyngeal                         airway and neck mobility. Respiratory Examination:                         clear to auscultation. CV Examination: normal.  Prophylactic Antibiotics: The patient does not require                         prophylactic antibiotics. Prior Anticoagulants: The                         patient has taken  no previous anticoagulant or                         antiplatelet agents. ASA Grade Assessment: II - A                         patient with mild systemic disease. After reviewing                         the risks and benefits, the patient was deemed in                         satisfactory condition to undergo the procedure. The                         anesthesia plan was to use monitored anesthesia care                         (MAC). Immediately prior to administration of                         medications, the patient was re-assessed for adequacy                         to receive sedatives. The heart rate, respiratory                         rate, oxygen saturations, blood pressure, adequacy of                         pulmonary ventilation, and response to care were                         monitored throughout the procedure. The physical                         status of the patient was re-assessed after the                         procedure.                        After obtaining informed consent, the colonoscope was                         passed under direct vision. Throughout the procedure,                         the patient's blood pressure, pulse, and oxygen                         saturations were monitored continuously. The  Colonoscope was introduced through the anus and                         advanced to the the cecum, identified by appendiceal                         orifice and ileocecal valve. The colonoscopy was                         performed without difficulty. The patient tolerated                         the procedure well. The quality of the bowel                         preparation was evaluated using the BBPS [facility] Bowel                         Preparation Scale) with scores of: Right Colon = 3,                         Transverse Colon = 3 and Left Colon = 3 (entire mucosa                         seen well with no residual staining, small  fragments                         of stool or opaque liquid). The total BBPS score                         equals 9. Findings:      The perianal and digital rectal examinations were normal. Pertinent       negatives include normal sphincter tone and no palpable rectal lesions.      Two sessile polyps were found in the recto-sigmoid colon. The polyps       were 3 to 4 mm in size. These polyps were removed with a cold snare.       Resection and retrieval were complete.      The retroflexed view of the distal rectum and anal verge was normal and       showed no anal or rectal abnormalities. Impression:            - Two 3 to 4 mm polyps at the recto-sigmoid colon,                         removed with a cold snare. Resected and retrieved.                        - The distal rectum and anal verge are normal on                         retroflexion view. Recommendation:        - Discharge patient to home (with escort).                        - Resume previous diet today.                        -  Continue present medications.                        - Await pathology results.                        - Repeat colonoscopy in 7-10 years for surveillance. Procedure Code(s):     --- Professional ---                        [phone], Colonoscopy, flexible; with removal of                         tumor(s), polyp(s), or other lesion(s) by snare                         technique Diagnosis Code(s):     --- Professional ---                        Z12.11, Encounter for screening for malignant neoplasm                         of colon                        K63.5, Polyp of colon CPT copyright 2019 American Medical Association. All rights reserved. The codes documented in this report are preliminary and upon coder review may  be revised to meet current compliance requirements. Dr. Ulyess Mort Lin Landsman MD, MD 02/25/2020 8:41:42 AM This report has been signed electronically. Number of Addenda: 0 Note  Initiated On: 02/25/2020 8:15 AM Scope Withdrawal Time: 0 hours 10 minutes 48 seconds  Total Procedure Duration: 0 hours 17 minutes 32 seconds  Estimated Blood Loss:  Estimated blood loss: none.      [facility]

## 2020-02-26 ENCOUNTER — Encounter: Payer: Self-pay | Admitting: Gastroenterology

## 2020-02-26 LAB — SURGICAL PATHOLOGY

## 2020-02-27 ENCOUNTER — Encounter: Payer: Self-pay | Admitting: Gastroenterology

## 2021-05-15 ENCOUNTER — Other Ambulatory Visit: Payer: Self-pay | Admitting: Adult Health

## 2021-05-15 DIAGNOSIS — Z1231 Encounter for screening mammogram for malignant neoplasm of breast: Secondary | ICD-10-CM

## 2021-05-18 ENCOUNTER — Other Ambulatory Visit: Payer: Self-pay

## 2021-05-18 ENCOUNTER — Ambulatory Visit
Admission: RE | Admit: 2021-05-18 | Discharge: 2021-05-18 | Disposition: A | Discharge status: Home / Self Care | Payer: Medicaid Other | Source: Ambulatory Visit | Attending: Adult Health | Admitting: Adult Health

## 2021-05-18 DIAGNOSIS — Z1231 Encounter for screening mammogram for malignant neoplasm of breast: Secondary | ICD-10-CM | POA: Diagnosis not present

## 2021-05-19 DIAGNOSIS — E559 Vitamin D deficiency, unspecified: Secondary | ICD-10-CM | POA: Diagnosis not present

## 2021-05-19 DIAGNOSIS — M329 Systemic lupus erythematosus, unspecified: Secondary | ICD-10-CM | POA: Diagnosis not present

## 2021-05-19 DIAGNOSIS — Z79899 Other long term (current) drug therapy: Secondary | ICD-10-CM | POA: Diagnosis not present

## 2021-05-19 NOTE — Progress Notes (Signed)
RECOMMENDATION: Screening mammogram in one year. (Code:SM-B-01Y) BI-RADS CATEGORY  1: Negative. Electronically Signed   By: Abelardo Diesel M.D.   On: 05/19/2021 08:26

## 2021-05-20 ENCOUNTER — Other Ambulatory Visit: Payer: Self-pay

## 2021-05-20 ENCOUNTER — Ambulatory Visit
Admission: EM | Admit: 2021-05-20 | Discharge: 2021-05-20 | Disposition: A | Discharge status: Home / Self Care | Payer: Medicaid Other | Attending: Medical Oncology | Admitting: Medical Oncology

## 2021-05-20 ENCOUNTER — Encounter: Payer: Self-pay | Admitting: Emergency Medicine

## 2021-05-20 DIAGNOSIS — L232 Allergic contact dermatitis due to cosmetics: Secondary | ICD-10-CM

## 2021-05-20 MED ORDER — HYDROCORTISONE 2.5 % EX CREA: TOPICAL_CREAM | Freq: Two times a day (BID) | CUTANEOUS | 0 refills | Status: AC

## 2021-05-20 MED ORDER — DESONIDE 0.05 % EX CREA: TOPICAL_CREAM | Freq: Two times a day (BID) | CUTANEOUS | 0 refills | Status: DC

## 2021-05-20 NOTE — Discharge Instructions (Addendum)
Stop you make up wipes and eye cream for 6 weeks before starting just one of them for a trial. When you restart the eye cream make sure to buffer this with a facial lotion (without SPF or retinol or "anti aging") to help avoid irritation and only use 3 times per week. You can slowly increase days of usage as tolerated.

## 2021-05-20 NOTE — ED Provider Notes (Addendum)
Roderic Palau    CSN: [id number] Arrival date & time: 05/20/21  1336      History   Chief Complaint Chief Complaint  Patient presents with   Eye Problem   Rash    HPI Wendy Hogan is a 45 y.o. female.   HPI  Rash: Pt reports that for the past 4 days she has had swelling and a rash under her eyes. This started after using a new anti aging eye cream and make up wipes. No other affected areas on the face. This is her first time using anti aging products. She denies pain, discharge, itching or eye complaints. No trouble breathing or swallowing. She has stopped the eye cream and has not applied any other medications.    Past Medical History:  Diagnosis Date   Abscess    gluteal   Anemia    Lupus ([place])    Non-healing surgical wound    R buttock   Sickle cell anemia ([place])    trait   Sickle cell trait ([place])    Syphilis     Patient Active Problem List   Diagnosis Date Noted   Encounter for screening colonoscopy    Acute conjunctivitis of right eye 11/27/2018   Other forms of systemic lupus erythematosus ([place]) 09/20/2016   Vitamin D deficiency 05/03/2016   Thrombocytosis 04/15/2016   Myalgia 04/15/2016   Low TSH level 04/15/2016   Elevated erythrocyte sedimentation rate 04/15/2016   Chronic fatigue 04/15/2016   ANA positive 04/07/2016   Arthralgia of multiple sites 04/05/2016   Microcytic anemia 04/05/2016   Sickle cell trait ([place]) 04/05/2016    Past Surgical History:  Procedure Laterality Date   COLONOSCOPY WITH PROPOFOL N/A 02/25/2020   Procedure: COLONOSCOPY WITH PROPOFOL;  Surgeon: Lin Landsman, MD;  Location: [place];  Service: Gastroenterology;  Laterality: N/A;   IRRIGATION AND DEBRIDEMENT ABSCESS     R buttock    OB History   No obstetric history on file.      Home Medications    Prior to Admission medications   Medication Sig Start Date End Date Taking? Authorizing Provider  Calcium Carbonate-Vit D-Min (CALCIUM 1200  PO) Take by mouth daily.    [provider]  ergocalciferol (VITAMIN D2) 1.25 MG (50000 UT) capsule Take one capsule once a week for 12 weeks 08/07/19   [provider]  ferrous fumarate (HEMOCYTE - 106 MG FE) 325 (106 Fe) MG TABS tablet Take 1 tablet (106 mg of iron total) by mouth 2 (two) times daily. 08/30/16   Copland, Frederico Hamman, MD  hydroxychloroquine (PLAQUENIL) 200 MG tablet Take 200 mg by mouth 2 (two) times daily. 05/10/19   [provider]  Multiple Vitamins-Minerals (MULTIVITAMIN GUMMIES ADULTS PO) Take by mouth daily.    [provider]  PROTOPIC 0.1 % ointment Apply topically 2 (two) times daily. 10/18/19   Brendolyn Patty, MD  tacrolimus (PROTOPIC) 0.1 % ointment APPLY TO AFFECTED EYELIDS TWICE A DAY AS NEEDED 12/27/19   Brendolyn Patty, MD    Family History Family History  Problem Relation Age of Onset   Diabetes Mother    Sickle cell trait Father    Cancer Maternal Grandfather        colon   Diabetes Maternal Aunt    Diabetes Maternal Grandmother    Breast cancer Neg Hx     Social History Social History   Tobacco Use   Smoking status: Every Day    Packs/day: 0.10  Types: Cigarettes   Smokeless tobacco: Never  Vaping Use   Vaping Use: Never used  Substance Use Topics   Alcohol use: Yes    Alcohol/week: 0.0 standard drinks    Comment: occasionally   Drug use: No     Allergies   Patient has no known allergies.   Review of Systems Review of Systems  As stated above in HPI Physical Exam Triage Vital Signs ED Triage Vitals  Enc Vitals Group     BP 05/20/21 1430 112/77     Pulse Rate 05/20/21 1430 71     Resp 05/20/21 1430 18     Temp 05/20/21 1430 97.8 F (36.6 C)     Temp Source 05/20/21 1430 Oral     SpO2 05/20/21 1430 95 %     Weight --      Height --      Head Circumference --      Peak Flow --      Pain Score 05/20/21 1435 0     Pain Loc --      Pain Edu? --      Excl. in [place]? --    No data found.  Updated  Vital Signs BP 112/77 (BP Location: Left Arm)    Pulse 71    Temp 97.8 F (36.6 C) (Oral)    Resp 18    SpO2 95%    Physical Exam Vitals and nursing note reviewed.  Constitutional:      General: She is not in acute distress.    Appearance: Normal appearance. She is not ill-appearing, toxic-appearing or diaphoretic.  HENT:     Head: Normocephalic and atraumatic.     Right Ear: Tympanic membrane normal.     Left Ear: Tympanic membrane normal.     Nose: Nose normal.     Mouth/Throat:     Mouth: Mucous membranes are moist.     Pharynx: Oropharynx is clear. No oropharyngeal exudate or posterior oropharyngeal erythema.  Eyes:     General: No scleral icterus.       Right eye: No discharge.        Left eye: No discharge.     Extraocular Movements: Extraocular movements intact.     Conjunctiva/sclera: Conjunctivae normal.     Pupils: Pupils are equal, round, and reactive to light.     Comments: Mild puffiness and erythema of the upper and lower eyelids bilaterally. No abnormalities of the eyes themselves.   Skin:    Comments: No other rash of body   Neurological:     Mental Status: She is alert.     UC Treatments / Results  Labs (all labs ordered are listed, but only abnormal results are displayed) Labs Reviewed - No data to display  EKG   Radiology MM 3D SCREEN BREAST BILATERAL  Result Date: 05/19/2021 CLINICAL DATA:  Screening. EXAM: DIGITAL SCREENING BILATERAL MAMMOGRAM WITH TOMOSYNTHESIS AND CAD TECHNIQUE: Bilateral screening digital craniocaudal and mediolateral oblique mammograms were obtained. Bilateral screening digital breast tomosynthesis was performed. The images were evaluated with computer-aided detection. COMPARISON:  Previous exam(s). ACR Breast Density Category c: The breast tissue is heterogeneously dense, which may obscure small masses. FINDINGS: There are no findings suspicious for malignancy. IMPRESSION: No mammographic evidence of malignancy. A result letter  of this screening mammogram will be mailed directly to the patient. RECOMMENDATION: Screening mammogram in one year. (Code:SM-B-01Y) BI-RADS CATEGORY  1: Negative. Electronically Signed   By: Abelardo Diesel M.D.   On: 05/19/2021 08:26  Procedures Procedures (including critical care time)  Medications Ordered in UC Medications - No data to display  Initial Impression / Assessment and Plan / UC Course  I have reviewed the triage vital signs and the nursing notes.  Pertinent labs & imaging results that were available during my care of the patient were reviewed by me and considered in my medical decision making (see chart for details).     New. Contact dermatitis likely to the retinol in her eye products. I have recommended a 6 week cessation of products then reintroducing one or the other first. In the meantime I have sent in a steroid cream to use to help symptoms along with cool compress recommendation. For the retinol I have recommended a buffering agent and starting only 3 times per week to help as she is retinol nieve.    Final Clinical Impressions(s) / UC Diagnoses   Final diagnoses:  None   Discharge Instructions   None    ED Prescriptions   None    PDMP not reviewed this encounter.   Hughie Closs, PA-C 05/20/21 [place], PA-C 05/20/21 1501

## 2021-05-20 NOTE — ED Triage Notes (Signed)
Pt states she started using a new eye cream and now both eyes have a rash and are swollen. No pain, itching or visual changes.

## 2021-08-14 ENCOUNTER — Other Ambulatory Visit: Payer: Self-pay

## 2021-08-14 ENCOUNTER — Ambulatory Visit
Admission: RE | Admit: 2021-08-14 | Discharge: 2021-08-14 | Disposition: A | Discharge status: Home / Self Care | Payer: Medicaid Other | Source: Ambulatory Visit | Attending: Physician Assistant | Admitting: Physician Assistant

## 2021-08-14 VITALS — BP 141/78 | HR 102 | Temp 99.1°F | Resp 20

## 2021-08-14 DIAGNOSIS — K0889 Other specified disorders of teeth and supporting structures: Secondary | ICD-10-CM | POA: Diagnosis not present

## 2021-08-14 MED ORDER — CLINDAMYCIN HCL 300 MG PO CAPS: 300.0000 mg | ORAL_CAPSULE | Freq: Three times a day (TID) | ORAL | 0 refills | Status: AC

## 2021-08-14 MED ORDER — NAPROXEN 500 MG PO TBEC: 500.0000 mg | DELAYED_RELEASE_TABLET | Freq: Two times a day (BID) | ORAL | 0 refills | Status: DC

## 2021-08-14 NOTE — ED Provider Notes (Signed)
?[facility] ? ? ? ?CSN: [id number] ?Arrival date & time: 08/14/21  1855 ? ? ?  ? ?History   ?Chief Complaint ?Chief Complaint  ?Patient presents with  ? Dental Pain  ? ? ?HPI ?Wendy Hogan is a 46 y.o. female.  ? ?The history is provided by the patient.  ?Dental Pain ?Location:  Lower ?Quality:  Aching ? ?Past Medical History:  ?Diagnosis Date  ? Abscess   ? gluteal  ? Anemia   ? Lupus ([place])   ? Non-healing surgical wound   ? R buttock  ? Sickle cell anemia (HCC)   ? trait  ? Sickle cell trait ([place])   ? Syphilis   ? ? ?Patient Active Problem List  ? Diagnosis Date Noted  ? Encounter for screening colonoscopy   ? Acute conjunctivitis of right eye 11/27/2018  ? Other forms of systemic lupus erythematosus ([place]) 09/20/2016  ? Vitamin D deficiency 05/03/2016  ? Thrombocytosis 04/15/2016  ? Myalgia 04/15/2016  ? Low TSH level 04/15/2016  ? Elevated erythrocyte sedimentation rate 04/15/2016  ? Chronic fatigue 04/15/2016  ? ANA positive 04/07/2016  ? Arthralgia of multiple sites 04/05/2016  ? Microcytic anemia 04/05/2016  ? Sickle cell trait ([place]) 04/05/2016  ? ? ?Past Surgical History:  ?Procedure Laterality Date  ? COLONOSCOPY WITH PROPOFOL N/A 02/25/2020  ? Procedure: COLONOSCOPY WITH PROPOFOL;  Surgeon: Lin Landsman, MD;  Location: [facility] ENDOSCOPY;  Service: Gastroenterology;  Laterality: N/A;  ? IRRIGATION AND DEBRIDEMENT ABSCESS    ? R buttock  ? ? ?OB History   ?No obstetric history on file. ?  ? ? ? ?Home Medications   ? ?Prior to Admission medications   ?Medication Sig Start Date End Date Taking? Authorizing Provider  ?clindamycin (CLEOCIN) 300 MG capsule Take 1 capsule (300 mg total) by mouth 3 (three) times daily for 10 days. 08/14/21 08/24/21 Yes Fransico Meadow, PA-C  ?naproxen (EC NAPROSYN) 500 MG EC tablet Take 1 tablet (500 mg total) by mouth 2 (two) times daily with a meal. 08/14/21  Yes Fransico Meadow, PA-C  ?Calcium Carbonate-Vit D-Min (CALCIUM 1200 PO) Take by mouth daily.    [provider]  ?desonide (DESOWEN) 0.05 % cream Apply topically 2 (two) times daily. 05/20/21   Hughie Closs, PA-C  ?ergocalciferol (VITAMIN D2) 1.25 MG (50000 UT) capsule Take one capsule once a week for 12 weeks 08/07/19   [provider]  ?ferrous fumarate (HEMOCYTE - 106 MG FE) 325 (106 Fe) MG TABS tablet Take 1 tablet (106 mg of iron total) by mouth 2 (two) times daily. 08/30/16   Copland, Frederico Hamman, MD  ?hydrocortisone 2.5 % cream Apply topically 2 (two) times daily. 05/20/21   Hughie Closs, PA-C  ?hydroxychloroquine (PLAQUENIL) 200 MG tablet Take 200 mg by mouth 2 (two) times daily. 05/10/19   [provider]  ?Multiple Vitamins-Minerals (MULTIVITAMIN GUMMIES ADULTS PO) Take by mouth daily.    [provider]  ?PROTOPIC 0.1 % ointment Apply topically 2 (two) times daily. 10/18/19   Brendolyn Patty, MD  ?tacrolimus (PROTOPIC) 0.1 % ointment APPLY TO AFFECTED EYELIDS TWICE A DAY AS NEEDED 12/27/19   Brendolyn Patty, MD  ? ? ?Family History ?Family History  ?Problem Relation Age of Onset  ? Diabetes Mother   ? Sickle cell trait Father   ? Cancer Maternal Grandfather   ?     colon  ? Diabetes Maternal Aunt   ? Diabetes Maternal Grandmother   ? Breast  cancer Neg Hx   ? ? ?Social History ?Social History  ? ?Tobacco Use  ? Smoking status: Every Day  ?  Packs/day: 0.10  ?  Types: Cigarettes  ? Smokeless tobacco: Never  ?Vaping Use  ? Vaping Use: Never used  ?Substance Use Topics  ? Alcohol use: Yes  ?  Alcohol/week: 0.0 standard drinks  ?  Comment: occasionally  ? Drug use: No  ? ? ? ?Allergies   ?Patient has no known allergies. ? ? ?Review of Systems ?Review of Systems  ?All other systems reviewed and are negative. ? ? ?Physical Exam ?Triage Vital Signs ?ED Triage Vitals  ?Enc Vitals Group  ?   BP 08/14/21 1905 (!) 141/78  ?   Pulse Rate 08/14/21 1905 (!) 102  ?   Resp 08/14/21 1905 20  ?   Temp 08/14/21 1905 99.1 ?F (37.3 ?C)  ?   Temp Source 08/14/21 1905 Oral  ?   SpO2 08/14/21 1905 98  %  ?   Weight --   ?   Height --   ?   Head Circumference --   ?   Peak Flow --   ?   Pain Score 08/14/21 1906 4  ?   Pain Loc --   ?   Pain Edu? --   ?   Excl. in [place]? --   ? ?No data found. ? ?Updated Vital Signs ?BP (!) 141/78   Pulse (!) 102   Temp 99.1 ?F (37.3 ?C) (Oral)   Resp 20   SpO2 98%  ? ?Visual Acuity ?Right Eye Distance:   ?Left Eye Distance:   ?Bilateral Distance:   ? ?Right Eye Near:   ?Left Eye Near:    ?Bilateral Near:    ? ?Physical Exam ?Vitals reviewed.  ?Constitutional:   ?   Appearance: Normal appearance.  ?HENT:  ?   Nose: Nose normal.  ?   Mouth/Throat:  ?   Comments: Swelling lower gum   tender left jaw ?Cardiovascular:  ?   Rate and Rhythm: Normal rate.  ?Pulmonary:  ?   Effort: Pulmonary effort is normal.  ?Musculoskeletal:     ?   General: Normal range of motion.  ?Skin: ?   General: Skin is warm.  ?Neurological:  ?   General: No focal deficit present.  ?   Mental Status: She is alert.  ? ? ? ?UC Treatments / Results  ?Labs ?(all labs ordered are listed, but only abnormal results are displayed) ?Labs Reviewed - No data to display ? ?EKG ? ? ?Radiology ?No results found. ? ?Procedures ?Procedures (including critical care time) ? ?Medications Ordered in UC ?Medications - No data to display ? ?Initial Impression / Assessment and Plan / UC Course  ?I have reviewed the triage vital signs and the nursing notes. ? ?Pertinent labs & imaging results that were available during my care of the patient were reviewed by me and considered in my medical decision making (see chart for details). ? ?  ? ?MDM patient advised she needs to follow-up with her dentist she has a couple of large fillings and lower molars that may be causing irritation and dental infection patient given a prescription for naproxen and clindamycin she is advised to call her dentist on Monday to schedule appointment for evaluation ?Final Clinical Impressions(s) / UC Diagnoses  ? ?Final diagnoses:  ?Pain, dental  ? ? ? ?Discharge  Instructions   ? ?  ?Schedule to see your dentist for evaluation  ? ? ?  ED Prescriptions   ? ? Medication Sig Dispense Auth. Provider  ? clindamycin (CLEOCIN) 300 MG capsule Take 1 capsule (300 mg total) by mouth 3 (three) times daily for 10 days. 30 capsule Caryl Ada K, [place]  ? naproxen (EC NAPROSYN) 500 MG EC tablet Take 1 tablet (500 mg total) by mouth 2 (two) times daily with a meal. 20 tablet Fransico Meadow, [place]  ? ?  ? ?PDMP not reviewed this encounter. ?  ?Fransico Meadow, PA-C ?08/14/21 2027 ? ?

## 2021-08-14 NOTE — ED Triage Notes (Signed)
Pt here with gum swelling and pain to left lower side of mouth since last night.  ?

## 2021-08-14 NOTE — Discharge Instructions (Signed)
Schedule to see your dentist for evaluation  ?

## 2022-03-24 ENCOUNTER — Ambulatory Visit (INDEPENDENT_AMBULATORY_CARE_PROVIDER_SITE_OTHER): Payer: Medicaid Other | Admitting: Physician Assistant

## 2022-03-24 ENCOUNTER — Encounter: Payer: Self-pay | Admitting: Physician Assistant

## 2022-03-24 VITALS — BP 103/73 | HR 66 | Temp 97.9°F | Resp 16 | Ht 65.0 in | Wt 166.4 lb

## 2022-03-24 DIAGNOSIS — D75839 Thrombocytosis, unspecified: Secondary | ICD-10-CM | POA: Diagnosis not present

## 2022-03-24 DIAGNOSIS — Z8742 Personal history of other diseases of the female genital tract: Secondary | ICD-10-CM

## 2022-03-24 DIAGNOSIS — R7989 Other specified abnormal findings of blood chemistry: Secondary | ICD-10-CM | POA: Diagnosis not present

## 2022-03-24 DIAGNOSIS — H02849 Edema of unspecified eye, unspecified eyelid: Secondary | ICD-10-CM | POA: Diagnosis not present

## 2022-03-24 DIAGNOSIS — Z01419 Encounter for gynecological examination (general) (routine) without abnormal findings: Secondary | ICD-10-CM

## 2022-03-24 DIAGNOSIS — D509 Iron deficiency anemia, unspecified: Secondary | ICD-10-CM

## 2022-03-24 DIAGNOSIS — E559 Vitamin D deficiency, unspecified: Secondary | ICD-10-CM

## 2022-03-24 DIAGNOSIS — M328 Other forms of systemic lupus erythematosus: Secondary | ICD-10-CM

## 2022-03-24 DIAGNOSIS — F17200 Nicotine dependence, unspecified, uncomplicated: Secondary | ICD-10-CM

## 2022-03-24 DIAGNOSIS — Z0000 Encounter for general adult medical examination without abnormal findings: Secondary | ICD-10-CM | POA: Diagnosis not present

## 2022-03-24 DIAGNOSIS — D573 Sickle-cell trait: Secondary | ICD-10-CM | POA: Diagnosis not present

## 2022-03-24 DIAGNOSIS — Z23 Encounter for immunization: Secondary | ICD-10-CM

## 2022-03-24 NOTE — Progress Notes (Unsigned)
I,Sulibeya S Dimas,acting as a Education administrator for [employer], PA-C.,have documented all relevant documentation on the behalf of Wendy Speak, PA-C,as directed by  [employer], PA-C while in the presence of [employer], PA-C.    Complete physical exam   Patient: Wendy Hogan   DOB: Nov 04, 1975   46 y.o. Female  MRN: [id number] Visit Date: 03/24/2022  Today's healthcare provider: Mardene Speak, PA-C   Chief Complaint  Patient presents with   Annual Exam   Subjective    [name] is a 46 y.o. female who presents today for a complete physical exam.  She reports consuming a general diet. The patient does not participate in regular exercise at present. She generally feels well. She reports sleeping poorly. She does have additional problems to discuss today.  HPI  Patient C/O irritation on eyelids.   Past Medical History:  Diagnosis Date   Abscess    gluteal   Anemia    Lupus (HCC)    Non-healing surgical wound    R buttock   Sickle cell anemia (HCC)    trait   Sickle cell trait ([place])    Syphilis    Past Surgical History:  Procedure Laterality Date   COLONOSCOPY WITH PROPOFOL N/A 02/25/2020   Procedure: COLONOSCOPY WITH PROPOFOL;  Surgeon: Lin Landsman, MD;  Location: [facility] ENDOSCOPY;  Service: Gastroenterology;  Laterality: N/A;   IRRIGATION AND DEBRIDEMENT ABSCESS     R buttock   Social History   Socioeconomic History   Marital status: Single    Spouse name: Not on file   Number of children: Not on file   Years of education: Not on file   Highest education level: Not on file  Occupational History   Not on file  Tobacco Use   Smoking status: Every Day    Packs/day: 0.10    Types: Cigarettes   Smokeless tobacco: Never  Vaping Use   Vaping Use: Never used  Substance and Sexual Activity   Alcohol use: Yes    Alcohol/week: 0.0 standard drinks of alcohol    Comment: occasionally   Drug use: No   Sexual activity: Yes    Birth  control/protection: None  Other Topics Concern   Not on file  Social History Narrative   Not on file   Social Determinants of Health   Financial Resource Strain: Not on file  Food Insecurity: Not on file  Transportation Needs: Not on file  Physical Activity: Not on file  Stress: Not on file  Social Connections: Not on file  Intimate Partner Violence: Not on file   Family Status  Relation Name Status   Mother  Alive   Father  Deceased   MGF  (Not Specified)   Mat Aunt  (Not Specified)   MGM  (Not Specified)   Neg Hx  (Not Specified)   Family History  Problem Relation Age of Onset   Diabetes Mother    Sickle cell trait Father    Cancer Maternal Grandfather        colon   Diabetes Maternal Aunt    Diabetes Maternal Grandmother    Breast cancer Neg Hx    No Known Allergies  Patient Care Team: Wendy Speak, PA-C as PCP - General (Physician Assistant)   Medications: Outpatient Medications Prior to Visit  Medication Sig   Calcium Carbonate-Vit D-Min (CALCIUM 1200 PO) Take by mouth daily.   desonide (DESOWEN) 0.05 % cream Apply topically 2 (two) times daily.   ergocalciferol (  VITAMIN D2) 1.25 MG (50000 UT) capsule Take one capsule once a week for 12 weeks   ferrous fumarate (HEMOCYTE - 106 MG FE) 325 (106 Fe) MG TABS tablet Take 1 tablet (106 mg of iron total) by mouth 2 (two) times daily.   hydrocortisone 2.5 % cream Apply topically 2 (two) times daily.   hydroxychloroquine (PLAQUENIL) 200 MG tablet Take 200 mg by mouth 2 (two) times daily.   Multiple Vitamins-Minerals (MULTIVITAMIN GUMMIES ADULTS PO) Take by mouth daily.   naproxen (EC NAPROSYN) 500 MG EC tablet Take 1 tablet (500 mg total) by mouth 2 (two) times daily with a meal.   PROTOPIC 0.1 % ointment Apply topically 2 (two) times daily.   tacrolimus (PROTOPIC) 0.1 % ointment APPLY TO AFFECTED EYELIDS TWICE A DAY AS NEEDED   No facility-administered medications prior to visit.    Review of Systems   Constitutional:  Positive for fatigue.  Skin:  Positive for rash.  All other systems reviewed and are negative.   Last CBC Lab Results  Component Value Date   WBC 7.4 09/13/2019   HGB 10.8 (L) 09/13/2019   HCT 34.2 09/13/2019   MCV 76 (L) 09/13/2019   MCH 23.9 (L) 09/13/2019   RDW 17.5 (H) 09/13/2019   PLT 237 73/22/0254   Last metabolic panel Lab Results  Component Value Date   GLUCOSE 98 09/13/2019   NA 140 09/13/2019   K 4.3 09/13/2019   CL 104 09/13/2019   CO2 23 09/13/2019   BUN 12 09/13/2019   CREATININE 0.61 09/13/2019   GFRNONAA 110 09/13/2019   CALCIUM 9.2 09/13/2019   PROT 7.2 09/13/2019   ALBUMIN 4.1 09/13/2019   LABGLOB 3.1 09/13/2019   AGRATIO 1.3 09/13/2019   BILITOT 0.3 09/13/2019   ALKPHOS 81 09/13/2019   AST 15 09/13/2019   ALT 14 09/13/2019   Last lipids Lab Results  Component Value Date   CHOL 154 09/13/2019   HDL 35 (L) 09/13/2019   LDLCALC 105 (H) 09/13/2019   TRIG 74 09/13/2019   CHOLHDL 4.4 09/13/2019   Last hemoglobin A1c Lab Results  Component Value Date   HGBA1C 4.9 09/13/2019   Last thyroid functions Lab Results  Component Value Date   TSH 0.593 09/13/2019       Objective    BP 103/73 (BP Location: Right Arm, Patient Position: Sitting, Cuff Size: Large)   Pulse 66   Temp 97.9 F (36.6 C) (Oral)   Resp 16   Ht '5\' 5"'$  (1.651 m)   Wt 166 lb 6.4 oz (75.5 kg)   BMI 27.69 kg/m  BP Readings from Last 3 Encounters:  03/24/22 103/73  08/14/21 (!) 141/78  05/20/21 112/77   Wt Readings from Last 3 Encounters:  03/24/22 166 lb 6.4 oz (75.5 kg)  02/25/20 140 lb (63.5 kg)  01/16/20 154 lb 3.2 oz (69.9 kg)       Physical Exam Vitals reviewed.  Constitutional:      General: She is not in acute distress.    Appearance: Normal appearance. She is well-developed. She is not diaphoretic.  HENT:     Head: Normocephalic and atraumatic.     Right Ear: Tympanic membrane, ear canal and external ear normal.     Left Ear:  Tympanic membrane, ear canal and external ear normal.     Nose: Nose normal.     Mouth/Throat:     Mouth: Mucous membranes are moist.     Pharynx: Oropharynx is clear. No oropharyngeal exudate.  Eyes:     General: No scleral icterus.    Extraocular Movements: Extraocular movements intact.     Conjunctiva/sclera: Conjunctivae normal.     Pupils: Pupils are equal, round, and reactive to light.  Neck:     Thyroid: No thyromegaly.  Cardiovascular:     Rate and Rhythm: Normal rate and regular rhythm.     Pulses: Normal pulses.     Heart sounds: Normal heart sounds. No murmur heard. Pulmonary:     Effort: Pulmonary effort is normal. No respiratory distress.     Breath sounds: Normal breath sounds. No wheezing or rales.  Abdominal:     General: There is no distension.     Palpations: Abdomen is soft.     Tenderness: There is no abdominal tenderness.  Musculoskeletal:        General: No deformity.     Cervical back: Neck supple.     Right lower leg: No edema.     Left lower leg: No edema.  Lymphadenopathy:     Cervical: No cervical adenopathy.  Skin:    General: Skin is warm and dry.     Findings: No rash.  Neurological:     Mental Status: She is alert and oriented to person, place, and time. Mental status is at baseline.     Sensory: No sensory deficit.     Motor: No weakness.     Gait: Gait normal.  Psychiatric:        Mood and Affect: Mood normal.        Behavior: Behavior normal.        Thought Content: Thought content normal.       Last depression screening scores    03/24/2022    8:41 AM 12/17/2019   10:53 AM 09/13/2019    9:17 AM  PHQ 2/9 Scores  PHQ - 2 Score 0 0 0  PHQ- 9 Score '3 1 2   '$ Last fall risk screening    03/24/2022    8:41 AM  [place] in the past year? 0  Number falls in past yr: 0  Injury with Fall? 0  Risk for fall due to : No Fall Risks  Follow up Falls evaluation completed   Last Audit-C alcohol use screening    03/24/2022     8:41 AM  Alcohol Use Disorder Test (AUDIT)  1. How often do you have a drink containing alcohol? 1  2. How many drinks containing alcohol do you have on a typical day when you are drinking? 0  3. How often do you have six or more drinks on one occasion? 0  AUDIT-C Score 1   A score of 3 or more in women, and 4 or more in men indicates increased risk for alcohol abuse, EXCEPT if all of the points are from question 1   No results found for any visits on 03/24/22.  Assessment & Plan    Routine Health Maintenance and Physical Exam  Exercise Activities and Dietary recommendations  Goals   None     Immunization History  Administered Date(s) Administered   Tdap 03/24/2022    Health Maintenance  Topic Date Due   COVID-19 Vaccine (1) 04/09/2022 (Originally 03/27/1981)   INFLUENZA VACCINE  08/29/2022 (Originally 12/29/2021)   PAP SMEAR-Modifier  12/17/2022   COLONOSCOPY (Pts 45-71yr Insurance coverage will need to be confirmed)  02/24/2030   TETANUS/TDAP  03/24/2032   Hepatitis C Screening  Completed   HIV Screening  Completed  HPV VACCINES  Aged Out    Discussed health benefits of physical activity, and encouraged her to engage in regular exercise appropriate for her age and condition.  1. Annual physical exam  - CBC with Differential/Platelet - Comprehensive metabolic panel - Lipid panel - Hemoglobin A1c Needs an annual eye exam and dental exam Things to do to keep yourself healthy  - Exercise at least 30-45 minutes a day, 3-4 days a week.  - Eat a low-fat diet with lots of fruits and vegetables, up to 7-9 servings per day.  - Seatbelts can save your life. Wear them always.  - Smoke detectors on every level of your home, check batteries every year.  - Eye Doctor - have an eye exam every 1-2 years  - Safe sex - if you may be exposed to STDs, use a condom.  - Alcohol -  If you drink, do it moderately, less than 2 drinks per day.  - [place]. Choose  someone to Hogan for you if you are not able.  - Depression is common in our stressful world.If you're feeling down or losing interest in things you normally enjoy, please come in for a visit.  - Violence - If anyone is threatening or hurting you, please call immediately.  2. Vitamin D deficiency  - VITAMIN D 25 Hydroxy (Vit-D Deficiency, Fractures)  3. Sickle cell trait (HCC)  - CBC with Differential/Platelet  4. Microcytic anemia  - CBC with Differential/Platelet  5. Thrombocytosis -CBC  6. Low TSH level  - TSH  7. Other forms of systemic lupus erythematosus, unspecified organ involvement status ([place])   8. Smoking   9. Need for Tdap vaccination  - Tdap vaccine greater than or equal to 7yo IM  10. Eyelid gland swelling, unspecified laterality Pt was diagnosed with allergic contact dermatitis in 04/2021 Was prescribed to [place] top steroid, pt d/c   - Ambulatory referral to Allergy  11. Women's annual routine gynecological examination  - Ambulatory referral to Obstetrics / Gynecology  12. Hx of abnormal cervical Pap smear  - Ambulatory referral to Obstetrics / Gynecology   FU PRN    The patient was advised to call back or seek an in-person evaluation if the symptoms worsen or if the condition fails to improve as anticipated.  I discussed the assessment and treatment plan with the patient. The patient was provided an opportunity to ask questions and all were answered. The patient agreed with the plan and demonstrated an understanding of the instructions.  The entirety of the information documented in the History of Present Illness, Review of Systems and Physical Exam were personally obtained by me. Portions of this information were initially documented by the CMA and reviewed by me for thoroughness and accuracy.  Portions of this note were created using dictation software and may contain typographical errors.     Wendy Speak, PA-C  [facility] [phone] (phone) [phone] (fax)  [place]

## 2022-03-25 ENCOUNTER — Encounter: Payer: Self-pay | Admitting: *Deleted

## 2022-03-25 LAB — CBC WITH DIFFERENTIAL/PLATELET
Basophils Absolute: 0 10*3/uL (ref 0.0–0.2)
Basos: 0 %
EOS (ABSOLUTE): 0.1 10*3/uL (ref 0.0–0.4)
Eos: 1 %
Hematocrit: 43.2 % (ref 34.0–46.6)
Hemoglobin: 14.3 g/dL (ref 11.1–15.9)
Immature Grans (Abs): 0 10*3/uL (ref 0.0–0.1)
Immature Granulocytes: 0 %
Lymphocytes Absolute: 2.9 10*3/uL (ref 0.7–3.1)
Lymphs: 49 %
MCH: 27.3 pg (ref 26.6–33.0)
MCHC: 33.1 g/dL (ref 31.5–35.7)
MCV: 82 fL (ref 79–97)
Monocytes Absolute: 0.5 10*3/uL (ref 0.1–0.9)
Monocytes: 9 %
Neutrophils Absolute: 2.4 10*3/uL (ref 1.4–7.0)
Neutrophils: 41 %
Platelets: 165 10*3/uL (ref 150–450)
RBC: 5.24 x10E6/uL (ref 3.77–5.28)
RDW: 12.7 % (ref 11.7–15.4)
WBC: 6 10*3/uL (ref 3.4–10.8)

## 2022-03-25 LAB — COMPREHENSIVE METABOLIC PANEL
ALT: 29 IU/L (ref 0–32)
AST: 25 IU/L (ref 0–40)
Albumin/Globulin Ratio: 1.7 (ref 1.2–2.2)
Albumin: 4.6 g/dL (ref 3.9–4.9)
Alkaline Phosphatase: 105 IU/L (ref 44–121)
BUN/Creatinine Ratio: 13 (ref 9–23)
BUN: 9 mg/dL (ref 6–24)
Bilirubin Total: 0.5 mg/dL (ref 0.0–1.2)
CO2: 24 mmol/L (ref 20–29)
Calcium: 9.4 mg/dL (ref 8.7–10.2)
Chloride: 102 mmol/L (ref 96–106)
Creatinine, Ser: 0.67 mg/dL (ref 0.57–1.00)
Globulin, Total: 2.7 g/dL (ref 1.5–4.5)
Glucose: 109 mg/dL — ABNORMAL HIGH (ref 70–99)
Potassium: 4.3 mmol/L (ref 3.5–5.2)
Sodium: 140 mmol/L (ref 134–144)
Total Protein: 7.3 g/dL (ref 6.0–8.5)
eGFR: 108 mL/min/{1.73_m2} (ref >59)

## 2022-03-25 LAB — LIPID PANEL
Chol/HDL Ratio: 6.2 ratio — ABNORMAL HIGH (ref 0.0–4.4)
Cholesterol, Total: 210 mg/dL — ABNORMAL HIGH (ref 100–199)
HDL: 34 mg/dL — ABNORMAL LOW (ref >39)
LDL Chol Calc (NIH): 159 mg/dL — ABNORMAL HIGH (ref 0–99)
Triglycerides: 94 mg/dL (ref 0–149)
VLDL Cholesterol Cal: 17 mg/dL (ref 5–40)

## 2022-03-25 LAB — HEMOGLOBIN A1C
Est. average glucose Bld gHb Est-mCnc: 111 mg/dL
Hgb A1c MFr Bld: 5.5 % (ref 4.8–5.6)

## 2022-03-25 LAB — TSH: TSH: 0.498 u[IU]/mL (ref 0.450–4.500)

## 2022-03-25 LAB — VITAMIN D 25 HYDROXY (VIT D DEFICIENCY, FRACTURES): Vit D, 25-Hydroxy: 19.3 ng/mL — ABNORMAL LOW (ref 30.0–100.0)

## 2022-03-25 NOTE — Progress Notes (Signed)
Labs are stable except some slight elevation of glucose and lipids/cholesterol. Lifestyle modifications via low-carb, low-cholesterol diet and daily exercise advised. Please, add omega-3 fatty acids over the counter.  Marland Kitchen

## 2022-04-19 ENCOUNTER — Encounter: Payer: Medicaid Other | Admitting: Advanced Practice Midwife

## 2022-05-03 ENCOUNTER — Encounter: Payer: Self-pay | Admitting: Licensed Practical Nurse

## 2022-05-03 ENCOUNTER — Ambulatory Visit (INDEPENDENT_AMBULATORY_CARE_PROVIDER_SITE_OTHER): Payer: Medicaid Other | Admitting: Licensed Practical Nurse

## 2022-05-03 VITALS — BP 106/79 | HR 83 | Ht 66.0 in | Wt 166.2 lb

## 2022-05-03 DIAGNOSIS — Z01419 Encounter for gynecological examination (general) (routine) without abnormal findings: Secondary | ICD-10-CM | POA: Diagnosis not present

## 2022-05-03 DIAGNOSIS — Z113 Encounter for screening for infections with a predominantly sexual mode of transmission: Secondary | ICD-10-CM | POA: Diagnosis not present

## 2022-05-03 NOTE — Progress Notes (Signed)
Gynecology Annual Exam  PCP: Mardene Speak, PA-C  Chief Complaint:  Chief Complaint  Patient presents with   Establish Care    History of Present Illness: Patient is a 46 y.o. G3P0010 presents for annual exam. The patient has no complaints today. Declines Pelvic exam   LMP: No LMP recorded. Patient is perimenopausal.  Postcoital Bleeding: no  The patient is occasionally sexually active, admits to not having much interest since entering menopause. . She currently uses post menopausal status for contraception. She denies dyspareunia.  The patient does perform self breast exams.  There is no notable family history of breast or ovarian cancer in her family.  The patient wears seatbelts: yes.   The patient has regular exercise: no.  But is aware she should   The patient denies current symptoms of depression.   Works in the [employer] at [employer] with her 29 year old daughter Wears glasses, eye exam up to date Dental exam up to date PCP up to date   Review of Systems: Review of Systems  Constitutional: Negative.   Eyes: Negative.   Respiratory: Negative.    Cardiovascular: Negative.   Gastrointestinal: Negative.   Genitourinary: Negative.   Musculoskeletal: Negative.   Neurological: Negative.   Endo/Heme/Allergies:        Hot flashes a couple times a week, not to bothersome at this time   Psychiatric/Behavioral: Negative.      Past Medical History:  Patient Active Problem List   Diagnosis Date Noted   Encounter for screening colonoscopy    Acute conjunctivitis of right eye 11/27/2018   Other forms of systemic lupus erythematosus ([place]) 09/20/2016    Formatting of this note might be different from the original. Arthralgias, myalgias, fatigue, + ANA, + Ds DNA    Vitamin D deficiency 05/03/2016   Thrombocytosis 04/15/2016   Myalgia 04/15/2016   Low TSH level 04/15/2016   Elevated erythrocyte sedimentation rate 04/15/2016   Chronic fatigue 04/15/2016    ANA positive 04/07/2016   Arthralgia of multiple sites 04/05/2016    With some swelling    Microcytic anemia 04/05/2016   Sickle cell trait ([place]) 04/05/2016    Past Surgical History:  Past Surgical History:  Procedure Laterality Date   COLONOSCOPY WITH PROPOFOL N/A 02/25/2020   Procedure: COLONOSCOPY WITH PROPOFOL;  Surgeon: Lin Landsman, MD;  Location: [facility] ENDOSCOPY;  Service: Gastroenterology;  Laterality: N/A;   IRRIGATION AND DEBRIDEMENT ABSCESS     R buttock    Gynecologic History:  No LMP recorded. Patient is perimenopausal. Contraception: post menopausal status Last Pap: Results were: 2021 no abnormalities  Last mammogram: 04/2021 Results were: BI-RAD I  Obstetric History: G3P0010  Family History:  Family History  Problem Relation Age of Onset   Diabetes Mother    Sickle cell trait Father    Cancer Maternal Grandfather        colon   Diabetes Maternal Aunt    Diabetes Maternal Grandmother    Breast cancer Neg Hx     Social History:  Social History   Socioeconomic History   Marital status: Single    Spouse name: Not on file   Number of children: Not on file   Years of education: Not on file   Highest education level: Not on file  Occupational History   Not on file  Tobacco Use   Smoking status: Every Day    Packs/day: 0.10    Types: Cigarettes   Smokeless tobacco: Never  Vaping Use   Vaping Use: Never used  Substance and Sexual Activity   Alcohol use: Yes    Alcohol/week: 0.0 standard drinks of alcohol    Comment: occasionally   Drug use: No   Sexual activity: Yes    Birth control/protection: None  Other Topics Concern   Not on file  Social History Narrative   Not on file   Social Determinants of Health   Financial Resource Strain: Not on file  Food Insecurity: Not on file  Transportation Needs: Not on file  Physical Activity: Not on file  Stress: Not on file  Social Connections: Not on file  Intimate Partner Violence: Not on  file    Allergies:  No Known Allergies  Medications: Prior to Admission medications   Medication Sig Start Date End Date Taking? Authorizing Provider  Calcium Carbonate-Vit D-Min (CALCIUM 1200 PO) Take by mouth daily.   Yes [provider]  ferrous fumarate (HEMOCYTE - 106 MG FE) 325 (106 Fe) MG TABS tablet Take 1 tablet (106 mg of iron total) by mouth 2 (two) times daily. 08/30/16  Yes Copland, Frederico Hamman, MD  hydroxychloroquine (PLAQUENIL) 200 MG tablet Take 200 mg by mouth 2 (two) times daily. 05/10/19  Yes [provider]  Multiple Vitamins-Minerals (MULTIVITAMIN GUMMIES ADULTS PO) Take by mouth daily.   Yes [provider]  hydrocortisone 2.5 % cream Apply topically 2 (two) times daily. Patient not taking: Reported on 05/03/2022 05/20/21   Vallery Sa    Physical Exam Vitals: Blood pressure 106/79, pulse 83, height '5\' 6"'$  (1.676 m), weight 166 lb 3.2 oz (75.4 kg).  General: NAD HEENT: normocephalic, anicteric Thyroid: no enlargement, no palpable nodules Pulmonary: No increased work of breathing, CTAB Cardiovascular: RRR, distal pulses 2+ Breast: Breast symmetrical, no tenderness, no palpable nodules or masses, no skin or nipple retraction present, no nipple discharge.  No axillary or supraclavicular lymphadenopathy. Abdomen: NABS, soft, non-tender, non-distended.  Umbilicus without lesions.  No hepatomegaly, splenomegaly or masses palpable. No evidence of hernia  Genitourinary:  External:  Declined exam  Vagina:  Declined exam   Cervix:   Uterus: not palpated in abd, declined bimanual exam   Adnexa: not palpated in abd, declined bimanual exam   Rectal: deferred  Lymphatic: no evidence of inguinal lymphadenopathy Extremities: no edema, erythema, or tenderness Neurologic: Grossly intact Psychiatric: mood appropriate, affect full      Assessment: 46 y.o. G3P0010 routine annual exam  Plan: Problem List Items Addressed This Visit    None Visit Diagnoses     Well woman exam    -  Primary   Relevant Orders   HEP, RPR, HIV Panel   Hepatitis C antibody   MM DIGITAL SCREENING BILATERAL   Screening examination for venereal disease       Relevant Orders   HEP, RPR, HIV Panel   Hepatitis C antibody       1) Mammogram - recommend yearly screening mammogram.  Mammogram Was ordered today   2) STI screening  wasoffered and  blood draw only  3) ASCCP guidelines and rational discussed.  Patient opts for every 3 years screening interval due 2024   4) Contraception - the patient is currently using  post menopausal status.    5) Colonoscopy -- Screening recommended starting at age 55 for average risk individuals, age 59 for individuals deemed at increased risk (including African Americans) and recommended to continue until age 37.  For patient age 45-85 individualized approach is recommended.  [facility] Rm  standard screening is via colonoscopy, Cologuard screening is an acceptable alternative for patient unwilling or unable to undergo colonoscopy.  "Colorectal cancer screening for average?risk adults: 2018 guideline update from the American Cancer Society"CA: A Cancer Journal for Clinicians: Oct 27, 2016  UP to date   69) Routine healthcare maintenance including cholesterol, diabetes screening discussed managed by PCP  7) No follow-ups on file.  Roberto Scales, [facility], [facility] 05/03/2022, 4:50 PM

## 2022-05-04 LAB — HEPATITIS C ANTIBODY: Hep C Virus Ab: NONREACTIVE

## 2022-05-04 LAB — HEP, RPR, HIV PANEL
HIV Screen 4th Generation wRfx: NONREACTIVE
Hepatitis B Surface Ag: NEGATIVE
RPR Ser Ql: REACTIVE — AB

## 2022-05-04 LAB — RPR, QUANT. (REFLEX): Rapid Plasma Reagin, Quant: 1:1 {titer} — ABNORMAL HIGH

## 2022-05-05 ENCOUNTER — Other Ambulatory Visit: Payer: Self-pay | Admitting: Licensed Practical Nurse

## 2022-05-05 DIAGNOSIS — Z113 Encounter for screening for infections with a predominantly sexual mode of transmission: Secondary | ICD-10-CM

## 2022-05-05 NOTE — Progress Notes (Signed)
TC to [place]: Your screening test came back positive for syphilis, there could reasons that this is a false positive, pt states she has Lupus. We need to do another lab to very if this is a syphilis infection.  Order placed for TPA, pt will go to [facility] at her [place], [facility]  05/05/22  4:39 PM

## 2022-05-06 ENCOUNTER — Telehealth: Payer: Self-pay

## 2022-05-06 ENCOUNTER — Telehealth: Payer: Self-pay | Admitting: Licensed Practical Nurse

## 2022-05-06 NOTE — Telephone Encounter (Signed)
Sanjana called. She was not at a private space when we talked yesterday. She wanted to let me know she was treated for Syphilis at age 46 and wonders if that is why her RPR came back positive. Her previous testing has been negative, but it is possible this result is related to past exposure. We should do the TPA to know for sure. Pt in agreement.  Roberto Scales, CNM  , [facility]  05/06/2022 6:43 PM

## 2022-05-06 NOTE — Telephone Encounter (Signed)
Pt called triage needing to speak with lydia, Pt advised you was not in the office. She requested I send a message for you to call her. Pt would not give any details.

## 2022-05-11 ENCOUNTER — Other Ambulatory Visit: Payer: Self-pay

## 2022-05-11 ENCOUNTER — Telehealth: Payer: Self-pay

## 2022-05-11 DIAGNOSIS — Z113 Encounter for screening for infections with a predominantly sexual mode of transmission: Secondary | ICD-10-CM | POA: Diagnosis not present

## 2022-05-11 NOTE — Telephone Encounter (Signed)
Pt calling; is at a [facility] for blood work and they don't have the order; adv pt I would release it.  Pt to call back if they still don't have it.

## 2022-05-13 LAB — T.PALLIDUM AB, TOTAL: Treponema pallidum Antibodies: REACTIVE — AB

## 2022-05-14 ENCOUNTER — Telehealth: Payer: Self-pay | Admitting: Licensed Practical Nurse

## 2022-05-14 NOTE — Telephone Encounter (Signed)
TC to [place], Your TPA was positive, with the positive RPR and 1:1 ratio, this does look like it is showing you had a past infection. Pt states she does not believe there is any reason she could have possibly been reinfected with syphilis she has been with a long  term partner and they barely have sex as it is, he does not have other partners.  Offered testing in 4 weeks to see if there a change in titers pt declines.  Roberto Scales, [facility]  12/45/2023 5:29 PM

## 2022-06-21 IMAGING — MG MM DIGITAL SCREENING BILAT W/ TOMO AND CAD
8 series · 9 of 24 positions shown · non-contrast
Comparison: Previous exam(s).

CLINICAL DATA: Screening.

EXAM:
DIGITAL SCREENING BILATERAL MAMMOGRAM WITH TOMOSYNTHESIS AND CAD
TECHNIQUE: Bilateral screening digital craniocaudal and mediolateral oblique
mammograms were obtained. Bilateral screening digital breast
tomosynthesis was performed. The images were evaluated with
computer-aided detection.

[L CC synth-2D]
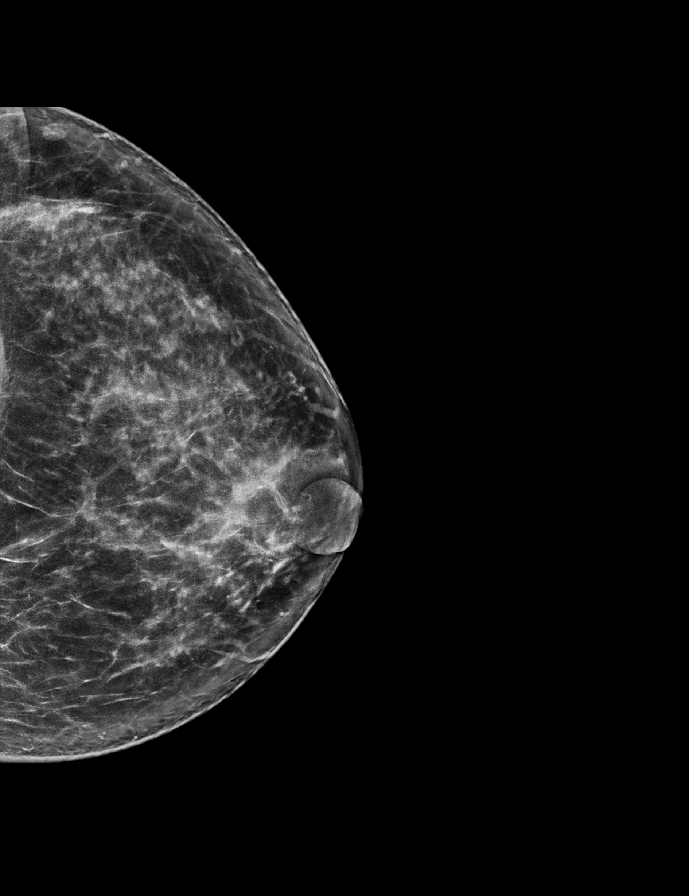

[R MLO synth-2D]
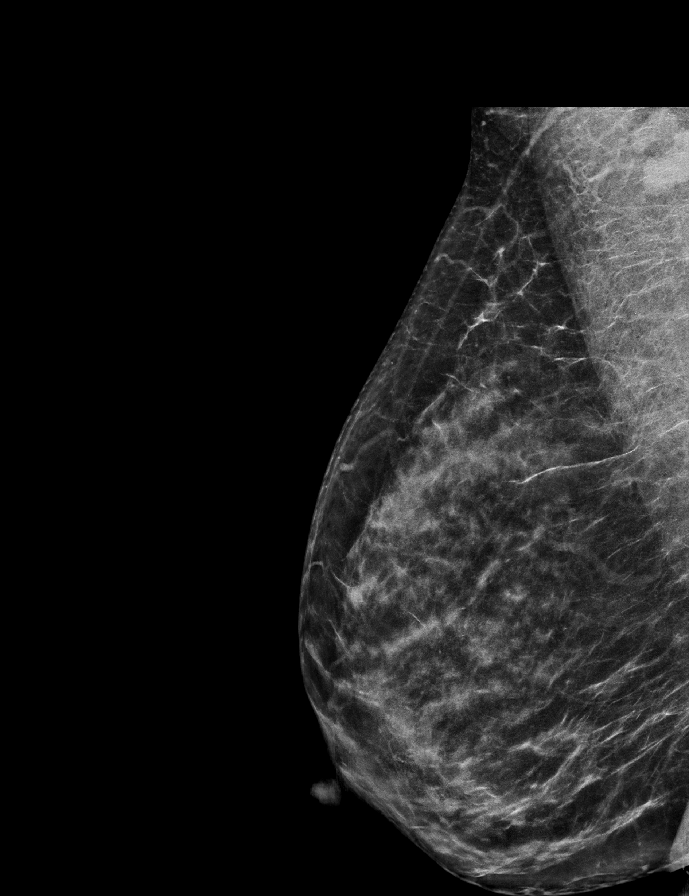

[L MLO synth-2D]
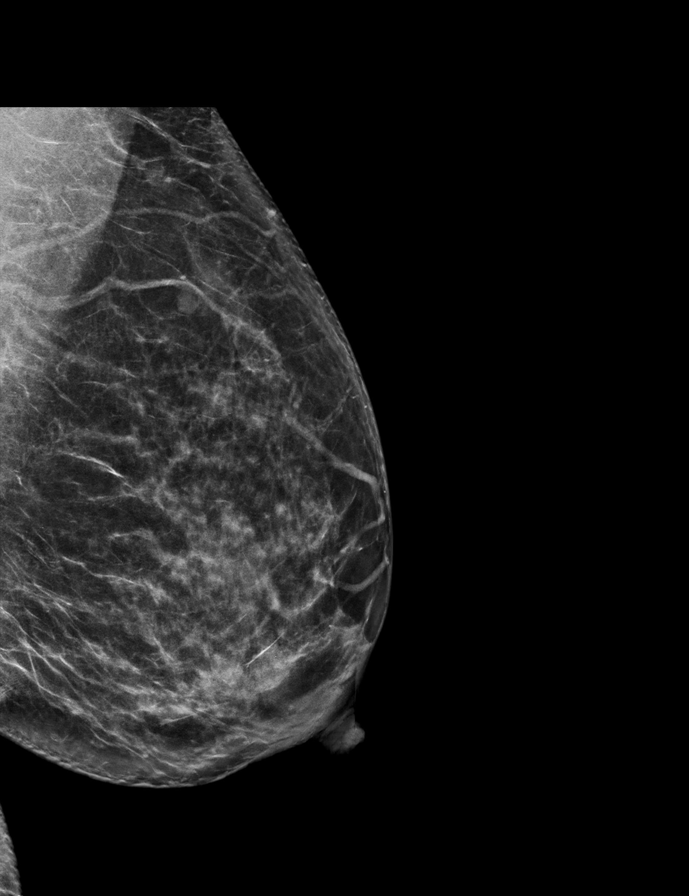

[R CC synth-2D]
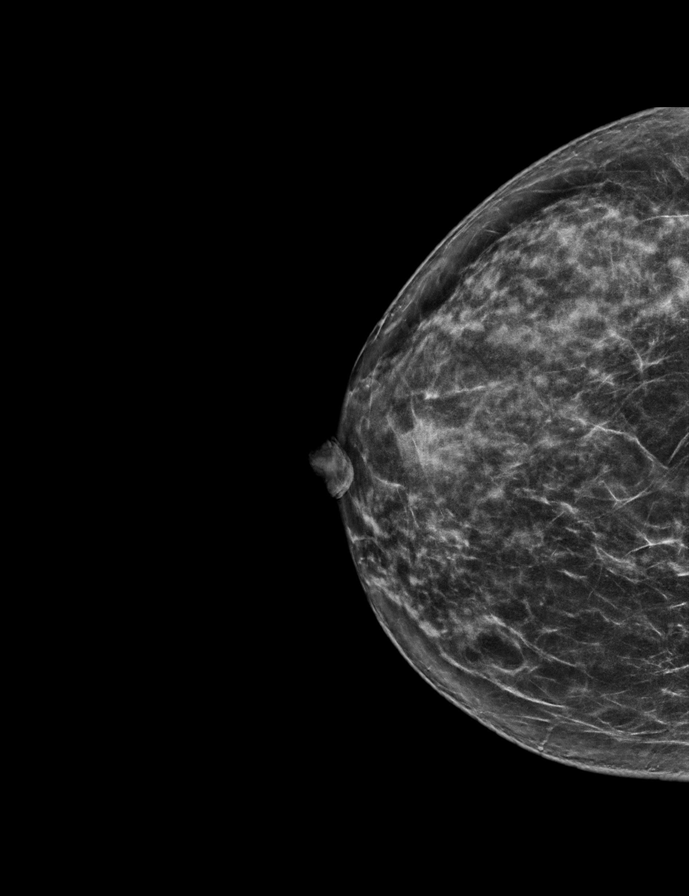

[R MLO tomo · 2 of 67 frames shown]
[frame 22/67]
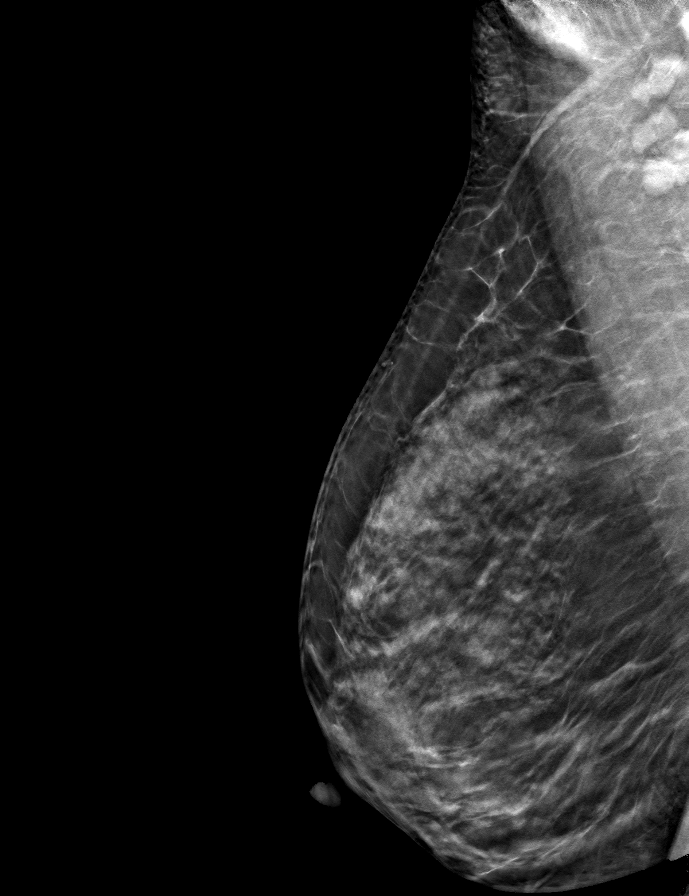
[frame 34/67]
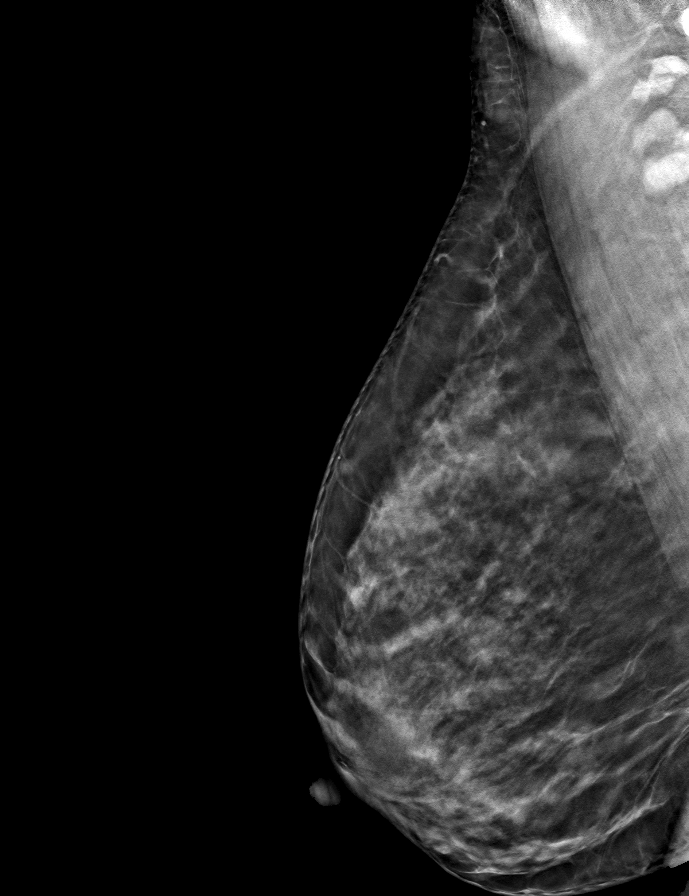

[L MLO tomo · tomo slice 35/68.0]
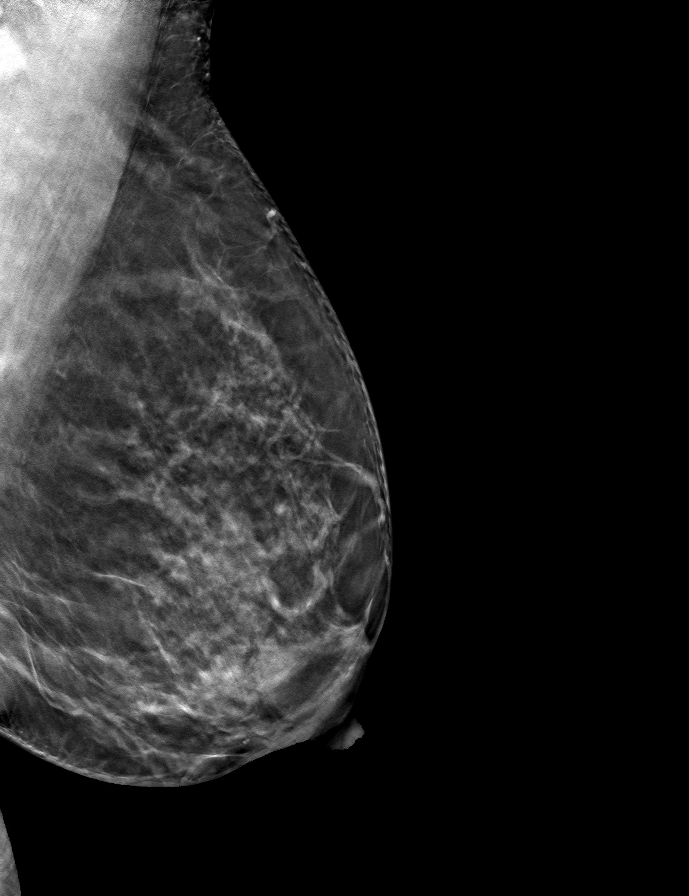

[R CC tomo · tomo slice 33/64.0]
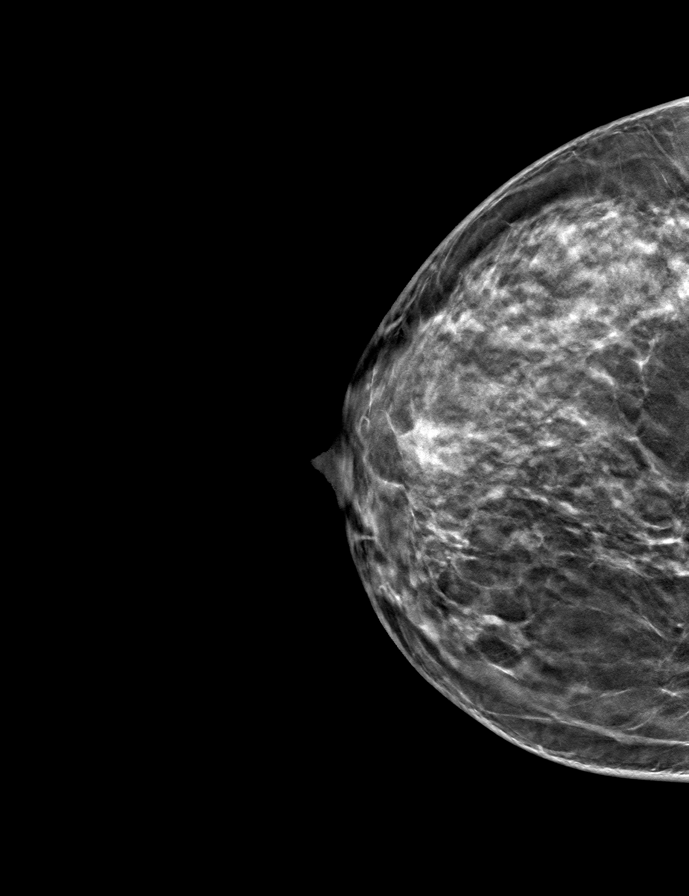

[L CC tomo · tomo slice 35/68.0]
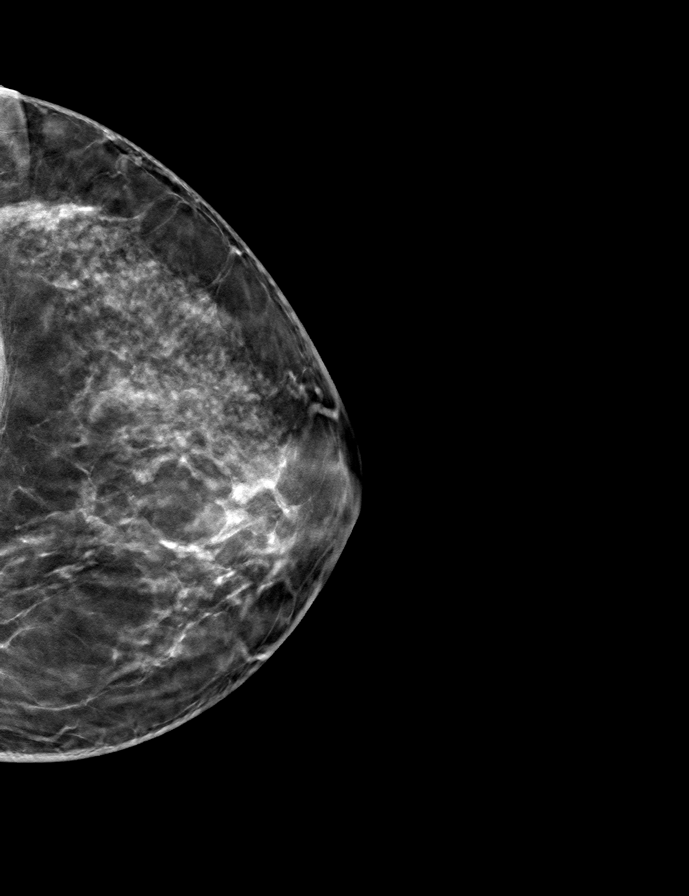

[9 of 24 positions shown; findings below may reference images not displayed]

ACR Breast Density Category c: The breast tissue is heterogeneously
dense, which may obscure small masses.
FINDINGS: There are no findings suspicious for malignancy.
IMPRESSION: No mammographic evidence of malignancy. A result letter of this
screening mammogram will be mailed directly to the patient.

RECOMMENDATION:
Screening mammogram in one year. (Code:[id number]-W-BC3)

BI-RADS CATEGORY  1: Negative.

## 2022-12-23 ENCOUNTER — Ambulatory Visit: Payer: Medicaid Other | Admitting: Family Medicine

## 2023-01-24 ENCOUNTER — Ambulatory Visit: Payer: BLUE CROSS/BLUE SHIELD | Admitting: Family Medicine

## 2023-01-26 LAB — HM MAMMOGRAPHY

## 2023-01-28 ENCOUNTER — Encounter: Payer: Self-pay | Admitting: Family Medicine

## 2023-02-28 ENCOUNTER — Ambulatory Visit: Payer: BLUE CROSS/BLUE SHIELD | Admitting: Physician Assistant

## 2023-06-27 ENCOUNTER — Ambulatory Visit: Payer: BLUE CROSS/BLUE SHIELD | Admitting: Family Medicine

## 2023-11-29 ENCOUNTER — Other Ambulatory Visit: Payer: Self-pay | Admitting: Physician Assistant

## 2023-11-29 DIAGNOSIS — Z1231 Encounter for screening mammogram for malignant neoplasm of breast: Secondary | ICD-10-CM

## 2024-03-05 ENCOUNTER — Ambulatory Visit: Payer: BLUE CROSS/BLUE SHIELD | Admitting: Family Medicine
